# Patient Record
Sex: Female | Born: 1955
Health system: Southern US, Community
[De-identification: ages and names within clinical notes are randomized; demographics above are authoritative.]

## PROBLEM LIST (undated history)

## (undated) DIAGNOSIS — I10 Essential (primary) hypertension: Secondary | ICD-10-CM

## (undated) HISTORY — DX: Essential (primary) hypertension: I10

## (undated) HISTORY — PX: TUBAL LIGATION: SHX77

---

## 1998-07-18 ENCOUNTER — Encounter: Payer: Self-pay | Admitting: Internal Medicine

## 1998-07-18 ENCOUNTER — Ambulatory Visit (HOSPITAL_COMMUNITY): Admission: RE | Admit: 1998-07-18 | Discharge: 1998-07-18 | Payer: Self-pay | Admitting: Internal Medicine

## 2000-08-25 ENCOUNTER — Other Ambulatory Visit: Admission: RE | Admit: 2000-08-25 | Discharge: 2000-08-25 | Payer: Self-pay | Admitting: Endocrinology

## 2001-02-12 ENCOUNTER — Other Ambulatory Visit: Admission: RE | Admit: 2001-02-12 | Discharge: 2001-02-12 | Payer: Self-pay | Admitting: Obstetrics and Gynecology

## 2002-02-25 ENCOUNTER — Other Ambulatory Visit: Admission: RE | Admit: 2002-02-25 | Discharge: 2002-02-25 | Payer: Self-pay | Admitting: Obstetrics and Gynecology

## 2003-04-21 ENCOUNTER — Other Ambulatory Visit: Admission: RE | Admit: 2003-04-21 | Discharge: 2003-04-21 | Payer: Self-pay | Admitting: Obstetrics and Gynecology

## 2003-11-28 ENCOUNTER — Ambulatory Visit (HOSPITAL_COMMUNITY): Admission: RE | Admit: 2003-11-28 | Discharge: 2003-11-28 | Payer: Self-pay | Admitting: Obstetrics and Gynecology

## 2004-04-30 ENCOUNTER — Other Ambulatory Visit: Admission: RE | Admit: 2004-04-30 | Discharge: 2004-04-30 | Payer: Self-pay | Admitting: Obstetrics and Gynecology

## 2004-07-25 ENCOUNTER — Other Ambulatory Visit: Admission: RE | Admit: 2004-07-25 | Discharge: 2004-07-25 | Payer: Self-pay | Admitting: Obstetrics and Gynecology

## 2004-09-06 ENCOUNTER — Emergency Department (HOSPITAL_COMMUNITY): Admission: EM | Admit: 2004-09-06 | Discharge: 2004-09-07 | Payer: Self-pay | Admitting: Emergency Medicine

## 2005-02-06 ENCOUNTER — Other Ambulatory Visit: Admission: RE | Admit: 2005-02-06 | Discharge: 2005-02-06 | Payer: Self-pay | Admitting: Obstetrics and Gynecology

## 2005-03-28 ENCOUNTER — Encounter: Admission: RE | Admit: 2005-03-28 | Discharge: 2005-03-28 | Payer: Self-pay | Admitting: Family Medicine

## 2005-04-03 ENCOUNTER — Encounter: Admission: RE | Admit: 2005-04-03 | Discharge: 2005-04-03 | Payer: Self-pay | Admitting: Family Medicine

## 2005-04-21 ENCOUNTER — Encounter: Admission: RE | Admit: 2005-04-21 | Discharge: 2005-04-21 | Payer: Self-pay | Admitting: Family Medicine

## 2005-09-03 ENCOUNTER — Encounter (INDEPENDENT_AMBULATORY_CARE_PROVIDER_SITE_OTHER): Payer: Self-pay | Admitting: Specialist

## 2005-09-03 ENCOUNTER — Ambulatory Visit (HOSPITAL_BASED_OUTPATIENT_CLINIC_OR_DEPARTMENT_OTHER): Admission: RE | Admit: 2005-09-03 | Discharge: 2005-09-03 | Payer: Self-pay | Admitting: Surgery

## 2005-09-05 ENCOUNTER — Other Ambulatory Visit: Admission: RE | Admit: 2005-09-05 | Discharge: 2005-09-05 | Payer: Self-pay | Admitting: Obstetrics and Gynecology

## 2006-02-20 ENCOUNTER — Encounter: Admission: RE | Admit: 2006-02-20 | Discharge: 2006-02-20 | Payer: Self-pay | Admitting: Obstetrics and Gynecology

## 2006-04-07 ENCOUNTER — Encounter: Admission: RE | Admit: 2006-04-07 | Discharge: 2006-04-07 | Payer: Self-pay | Admitting: Surgery

## 2007-04-15 ENCOUNTER — Encounter: Admission: RE | Admit: 2007-04-15 | Discharge: 2007-04-15 | Payer: Self-pay | Admitting: Obstetrics and Gynecology

## 2007-11-19 ENCOUNTER — Encounter: Admission: RE | Admit: 2007-11-19 | Discharge: 2007-11-19 | Payer: Self-pay | Admitting: Obstetrics and Gynecology

## 2010-07-14 ENCOUNTER — Encounter: Payer: Self-pay | Admitting: Obstetrics and Gynecology

## 2010-11-08 NOTE — Op Note (Signed)
NAMEMARYROSE, Audrey Wells            ACCOUNT NO.:  [id number]   MEDICAL RECORD NO.:  [id number]          PATIENT TYPE:  AMB   LOCATION:  DSC                          FACILITY:  [facility]   PHYSICIAN:  Thornton Park. Daphine Deutscher, MD  DATE OF BIRTH:  1956-02-17   DATE OF PROCEDURE:  09/03/2005  DATE OF DISCHARGE:                                 OPERATIVE REPORT   [facility]S [phone]   SURGEON:  Thornton Park. Daphine Deutscher, MD.   PREOP DIAGNOSIS:  Nipple discharge and palpable mass, right back.   PROCEDURE:  1.  Cannulation of the uniductular discharge, duct of right nipple with      excision of ductules.  2.  Excision of lipoma, right flank.   ANESTHESIA:  General endotracheal.   DESCRIPTION OF PROCEDURE:  Ms. Audrey Wells was marked in the holding  area for the palpable mass in the right posterior hip region and the correct  nipple was marked as well. She is brought back to OR 3 at [facility],  where we worked on the breast first. This was prepped with chlorhexidine. I  compressed the nipple and then got a nice flush of golden oily discharge. I  cannulated that with a 00 tear duct probe. I made an infra-areolar incision  and carried this in until I could see the duct where the probe went about an  inch in underneath the nipple. I then opened the duct within the breast and  pulled the duct out of the nipple and reinserted the duct probe down in the  duct. I then cut across this up near where it came out of the nipple and  tried to excise the entire dilated ductule.  I went ahead and excised this  and as I went more medial, I encountered another dilated duct which I  excised more central and deep to the breast and I encountered a well filled  cyst and seemed to remove all of this in toto. When completed, I had a  biopsy of these connecting ductules that seemed to have the discharge that  has been coming up in her nipple.   I made a small infra-areolar incision. I cauterized the cavity with  electrocautery. Essentially no bleeding was noted. I then closed the wound  with a 4-0 and 5-0 Vicryl with Benzoin and Steri-Strips around the areola.   The patient was then turned onto her left lateral decubitus position, so  that the right flank was up and as I had previously marked that area with  her assistance back in the holding area. I prepped that area with  chlorhexidine. Made a small transverse incision over this area and then  carried this down deep near fascia where I encountered about a 3-cm lipoma  which seemed to pop out and I did excise the surrounding capsule. Bleeding  was controlled with electrocautery. The lipoma when I removed it, sort came  to pieces, but I sent it off for permanent sections.  Wound was then irrigated and closed with 4-0 Vicryl subcutaneously and  Benzoin and Steri-Strips. The patient seemed to tolerate the procedure well.  She  will be given Vicodin to have to take as needed for pain will be  followed up in the office in approximately 3 weeks.      Thornton Park Daphine Deutscher, MD  Electronically Signed     MBM/MEDQ  D:  09/03/2005  T:  09/04/2005  Job:  [id number]   cc:   Guy Sandifer. Henderson Cloud, M.D.  Fax: [phone]   Dr. Roney Marion, [facility]

## 2011-01-30 ENCOUNTER — Encounter (INDEPENDENT_AMBULATORY_CARE_PROVIDER_SITE_OTHER): Payer: Self-pay | Admitting: Surgery

## 2011-02-27 ENCOUNTER — Ambulatory Visit (INDEPENDENT_AMBULATORY_CARE_PROVIDER_SITE_OTHER): Payer: 59 | Admitting: Surgery

## 2011-02-27 ENCOUNTER — Ambulatory Visit
Admission: RE | Admit: 2011-02-27 | Discharge: 2011-02-27 | Disposition: A | Discharge status: Home / Self Care | Payer: Self-pay | Source: Ambulatory Visit | Attending: Surgery | Admitting: Surgery

## 2011-02-27 ENCOUNTER — Other Ambulatory Visit (INDEPENDENT_AMBULATORY_CARE_PROVIDER_SITE_OTHER): Payer: Self-pay | Admitting: General Surgery

## 2011-02-27 ENCOUNTER — Encounter (INDEPENDENT_AMBULATORY_CARE_PROVIDER_SITE_OTHER): Payer: Self-pay | Admitting: Surgery

## 2011-02-27 VITALS — BP 114/84 | HR 66 | Temp 98.4°F | Ht 66.0 in | Wt 167.0 lb

## 2011-02-27 DIAGNOSIS — R109 Unspecified abdominal pain: Secondary | ICD-10-CM

## 2011-02-27 NOTE — Progress Notes (Signed)
Mrs. Teti comes in today with some pain on her right side. Previously I had resected a lipoma from her right flank home September 03, 2005. I did that at the same time as I did a right nipple duct tumor excision. The path on this showed benign adipose tissue consistent with a lipoma.I reviewed a disc that she brought from [facility] but this was an ultrasound of her kidneys.  She needs imaging of this area in her right flank  She is still complaining of pain around more anterior from this and it causes her have back pain. On palpation I can feel an area there that could represent a new mass or it could be ectopic asymmetric adipose tissue. I think to discern whether this might be significant or not a MR scan of this area would be helpful.I will order that study.    Plan return after MR study.

## 2011-03-14 ENCOUNTER — Encounter (INDEPENDENT_AMBULATORY_CARE_PROVIDER_SITE_OTHER): Payer: 59 | Admitting: Surgery

## 2011-03-19 ENCOUNTER — Encounter (INDEPENDENT_AMBULATORY_CARE_PROVIDER_SITE_OTHER): Payer: 59 | Admitting: Surgery

## 2011-05-14 ENCOUNTER — Encounter (INDEPENDENT_AMBULATORY_CARE_PROVIDER_SITE_OTHER): Payer: Self-pay | Admitting: Surgery

## 2011-05-14 ENCOUNTER — Ambulatory Visit (INDEPENDENT_AMBULATORY_CARE_PROVIDER_SITE_OTHER): Payer: 59 | Admitting: Surgery

## 2011-05-14 VITALS — BP 142/76 | HR 66 | Temp 98.6°F | Resp 18 | Ht 66.0 in | Wt 164.0 lb

## 2011-05-14 DIAGNOSIS — D171 Benign lipomatous neoplasm of skin and subcutaneous tissue of trunk: Secondary | ICD-10-CM

## 2011-05-14 DIAGNOSIS — D1739 Benign lipomatous neoplasm of skin and subcutaneous tissue of other sites: Secondary | ICD-10-CM

## 2011-05-14 NOTE — Progress Notes (Signed)
Mr. And Mrs. Hollerbach come in today to discuss the area her right flank where she underwent laparoscopic cholecystectomy removed. A review the CT of her abdomen dated September which showed essentially no change in the area and also the 1.2 cm recurrent lipoma was suspected in that region. There were no suspicious aspects of this.  We discussed management options and she would like to will manage this expectantly. I recommended that she examine herself probably every 6 months in the shower first thing in the morning. If she notices a change in call us and we will see her in evaluated.  Impression lipoma right flank stable plan we'll observation

## 2011-07-21 ENCOUNTER — Ambulatory Visit (INDEPENDENT_AMBULATORY_CARE_PROVIDER_SITE_OTHER): Payer: 59

## 2011-07-21 DIAGNOSIS — I10 Essential (primary) hypertension: Secondary | ICD-10-CM

## 2012-06-23 ENCOUNTER — Ambulatory Visit (INDEPENDENT_AMBULATORY_CARE_PROVIDER_SITE_OTHER): Payer: 59 | Admitting: Emergency Medicine

## 2012-06-23 VITALS — BP 128/80 | HR 72 | Temp 98.9°F | Resp 17 | Ht 67.0 in | Wt 165.0 lb

## 2012-06-23 DIAGNOSIS — R300 Dysuria: Secondary | ICD-10-CM

## 2012-06-23 DIAGNOSIS — N3000 Acute cystitis without hematuria: Secondary | ICD-10-CM

## 2012-06-23 LAB — POCT URINALYSIS DIPSTICK
Bilirubin, UA: NEGATIVE
Ketones, UA: NEGATIVE
Nitrite, UA: POSITIVE
Protein, UA: NEGATIVE

## 2012-06-23 LAB — POCT UA - MICROSCOPIC ONLY

## 2012-06-23 MED ORDER — CIPROFLOXACIN HCL 500 MG PO TABS: 500.0000 mg | ORAL_TABLET | Freq: Two times a day (BID) | ORAL | Status: DC

## 2012-06-23 MED ORDER — PHENAZOPYRIDINE HCL 200 MG PO TABS: 200.0000 mg | ORAL_TABLET | Freq: Three times a day (TID) | ORAL | Status: AC | PRN

## 2012-06-23 NOTE — Progress Notes (Signed)
[facility] [address] [place] [address] [phone]  Date:  06/23/2012   Name:  Audrey Wells   DOB:  March 18, 1956   MRN:  [id number]  PCP:  Provider Not In System    Chief Complaint: Urinary Tract Infection   History of Present Illness:  Audrey Wells is a 57 y.o. very pleasant female patient who presents with the following:  Monday developed dysuria, urgency and frequent urination.  No back pain, vaginal discharge or bleeding. No stool change, no fever or chills.  No change in activities or appetite.  No improvement with azo standard.  There is no problem list on file for this patient.   Past Medical History  Diagnosis Date  . Hypertension     History reviewed. No pertinent past surgical history.  History  Substance Use Topics  . Smoking status: Never Smoker   . Smokeless tobacco: Not on file  . Alcohol Use: No    Family History  Problem Relation Age of Onset  . Arthritis Mother   . Hypertension Father   . Hypertension Brother     No Known Allergies  Medication list has been reviewed and updated.  Current Outpatient Prescriptions on File Prior to Visit  Medication Sig Dispense Refill  . aspirin 81 MG tablet Take 81 mg by mouth daily.        . hydrochlorothiazide (HYDRODIURIL) 12.5 MG tablet       . meloxicam (MOBIC) 7.5 MG tablet         Review of Systems:  As per HPI, otherwise negative.    Physical Examination: Filed Vitals:   06/23/12 1412  BP: 128/80  Pulse: 72  Temp: 98.9 F (37.2 C)  Resp: 17   Filed Vitals:   06/23/12 1412  Height: 5\' 7"  (1.702 m)  Weight: 165 lb (74.844 kg)   Body mass index is 25.84 kg/(m^2). Ideal Body Weight: Weight in (lb) to have BMI = 25: 159.3    GEN: WDWN, NAD, Non-toxic, Alert & Oriented x 3 HEENT: Atraumatic, Normocephalic.  Ears and Nose: No external deformity. EXTR: No clubbing/cyanosis/edema NEURO: Normal gait.  PSYCH: Normally interactive. Conversant. Not  depressed or anxious appearing.  Calm demeanor.  ABD:  Benign no CVA tenderness  Assessment and Plan: Acute cystitis Pyridium cipro Follow up as needed  Carmelina Dane, MD  Results for orders placed in visit on 06/23/12  POCT UA - MICROSCOPIC ONLY      Component Value Range   WBC, Ur, HPF, POC 12-15     RBC, urine, microscopic 17-25     Bacteria, U Microscopic 3+     Mucus, UA neg     Epithelial cells, urine per micros 0-3     Crystals, Ur, HPF, POC neg     Casts, Ur, LPF, POC neg     Yeast, UA neg    POCT URINALYSIS DIPSTICK      Component Value Range   Color, UA yellow     Clarity, UA cloudy     Glucose, UA neg     Bilirubin, UA neg     Ketones, UA neg     Spec Grav, UA 1.015     Blood, UA large     pH, UA 7.0     Protein, UA neg     Urobilinogen, UA 0.2     Nitrite, UA positive     Leukocytes, UA moderate (2+)

## 2012-06-24 NOTE — Progress Notes (Signed)
Reviewed and agree.

## 2012-07-17 ENCOUNTER — Other Ambulatory Visit: Payer: Self-pay | Admitting: Family Medicine

## 2012-07-19 NOTE — Telephone Encounter (Signed)
HCTZ not prescribed by our office.

## 2012-07-23 ENCOUNTER — Other Ambulatory Visit: Payer: Self-pay | Admitting: Family Medicine

## 2012-07-23 NOTE — Telephone Encounter (Signed)
Pharmacy just called regarding this patient.  Best [phone]

## 2012-07-23 NOTE — Telephone Encounter (Signed)
Please pull paper chart.  

## 2012-07-23 NOTE — Telephone Encounter (Signed)
Patient is out of her BP diuretic medication and needs a refill. States she contacted her pharmacy and they sent [place] a request and they received a response from our office stating that she was not a patient here. Patient states our office is her PCP and she has been coming here for years so that is a concern for her. She is out of her medication. Uses [facility] on [address] Call Back # is [phone].

## 2012-07-23 NOTE — Telephone Encounter (Signed)
Chart pulled to PA pool at nurses station [phone]

## 2012-07-25 ENCOUNTER — Other Ambulatory Visit: Payer: Self-pay | Admitting: Physician Assistant

## 2012-08-24 ENCOUNTER — Other Ambulatory Visit: Payer: Self-pay | Admitting: Physician Assistant

## 2012-09-20 ENCOUNTER — Other Ambulatory Visit: Payer: Self-pay | Admitting: Physician Assistant

## 2012-09-24 ENCOUNTER — Ambulatory Visit (INDEPENDENT_AMBULATORY_CARE_PROVIDER_SITE_OTHER): Payer: 59 | Admitting: Physician Assistant

## 2012-09-24 VITALS — BP 164/90 | HR 66 | Temp 98.5°F | Resp 16 | Ht 67.0 in | Wt 167.0 lb

## 2012-09-24 DIAGNOSIS — I10 Essential (primary) hypertension: Secondary | ICD-10-CM

## 2012-09-24 MED ORDER — HYDROCHLOROTHIAZIDE 12.5 MG PO TABS: 12.5000 mg | ORAL_TABLET | Freq: Every day | ORAL | Status: DC

## 2012-09-24 NOTE — Progress Notes (Signed)
  Subjective:    Patient ID: Audrey Wells, female    DOB: March 19, 1956, 57 y.o.   MRN: [id number]  HPI 18 year ol dd female presents for refills of blood pressure medication.  Has been doing well on current dose - HCTZ 12.5 mg daily.  Gets annual exams from GYN that include lipids.  States everything has been normal in the past and her blood pressures at home are typically 130/80's.  At last OV in 06/2012 BP was well controlled. She admits to recent increase in fatty foods and decrease in exercise.  Denies chest pain, SOB, headaches, palpitations, nausea, or vomiting.      Review of Systems  Constitutional: Negative for fever and chills.  Eyes: Negative for visual disturbance.  Respiratory: Negative for chest tightness and shortness of breath.   Cardiovascular: Negative for chest pain and palpitations.  Neurological: Negative for dizziness and headaches.       Objective:   Physical Exam  Constitutional: She is oriented to person, place, and time. She appears well-developed and well-nourished.  HENT:  Head: Normocephalic and atraumatic.  Right Ear: External ear normal.  Left Ear: External ear normal.  Eyes: Conjunctivae are normal.  Neck: Normal range of motion. Neck supple. No thyromegaly present.  Cardiovascular: Normal rate, regular rhythm and normal heart sounds.   Pulmonary/Chest: Effort normal and breath sounds normal.  Lymphadenopathy:    She has no cervical adenopathy.  Neurological: She is alert and oriented to person, place, and time.  Psychiatric: She has a normal mood and affect. Her behavior is normal. Judgment and thought content normal.          Assessment & Plan:  Essential hypertension, benign - Plan: Comprehensive metabolic panel, hydrochlorothiazide (HYDRODIURIL) 12.5 MG tablet  CMET pending All other labs with GYN.  Continue HCTZ 12.5 mg daily. Check BP's at home and at annual - if continued to be elevated above 140/90, recommend recheck here in 6 months.

## 2012-09-25 LAB — COMPREHENSIVE METABOLIC PANEL
ALT: 20 U/L (ref 0–35)
AST: 25 U/L (ref 0–37)
Albumin: 4.4 g/dL (ref 3.5–5.2)
Alkaline Phosphatase: 70 U/L (ref 39–117)
BUN: 13 mg/dL (ref 6–23)
CO2: 30 mEq/L (ref 19–32)
Calcium: 9.6 mg/dL (ref 8.4–10.5)
Chloride: 104 mEq/L (ref 96–112)
Creat: 0.83 mg/dL (ref 0.50–1.10)
Glucose, Bld: 80 mg/dL (ref 70–99)
Potassium: 3.5 mEq/L (ref 3.5–5.3)
Sodium: 142 mEq/L (ref 135–145)
Total Bilirubin: 0.3 mg/dL (ref 0.3–1.2)
Total Protein: 7.6 g/dL (ref 6.0–8.3)

## 2012-10-26 ENCOUNTER — Telehealth: Payer: Self-pay

## 2012-10-26 NOTE — Telephone Encounter (Signed)
Patient is wondering if we got refill request for meloxicam please call patient at [phone]

## 2012-10-27 ENCOUNTER — Telehealth: Payer: Self-pay

## 2012-10-27 NOTE — Telephone Encounter (Signed)
Records have been faxed to Dr.Votek now.

## 2012-10-27 NOTE — Telephone Encounter (Signed)
We would need to see her for this since we have never prescribed this for her. From the note it seems like she has primarily seen her GYN for primary care, so maybe she needs to call them.

## 2012-10-27 NOTE — Telephone Encounter (Signed)
Records were already sent today by fax. Will double check if it went through epic.

## 2012-10-27 NOTE — Telephone Encounter (Signed)
PATIENT STATES SHE HAS CALLED 4X ABOUT A FAX THAT SHE SENT OVER AND HAS NOT HEARD ANYTHING YET. STATES THAT SHE NEEDS COPIES OF HER CLINICAL PANEL SENT OVER TO THE OFFICE OF ANNA VOYTEK. PATIENT DID NOT GIVE THE FAX NUMBER. PT CAN BE REACHED AT [phone].

## 2012-10-27 NOTE — Telephone Encounter (Signed)
Was seen for elevated BP, do not see where we have given this med? Office visit or okay?

## 2012-10-27 NOTE — Telephone Encounter (Signed)
Patient has been getting this from Dr Charlett Blake, she does not want [place] to renew this, she wants [place] to give her lab results to Dr Charlett Blake, so she can refill this for her. Labs faxed.

## 2013-04-12 ENCOUNTER — Ambulatory Visit: Payer: 59

## 2013-04-12 ENCOUNTER — Ambulatory Visit (INDEPENDENT_AMBULATORY_CARE_PROVIDER_SITE_OTHER): Payer: 59 | Admitting: Family Medicine

## 2013-04-12 VITALS — BP 140/80 | HR 72 | Temp 98.6°F | Resp 16 | Ht 66.0 in | Wt 168.0 lb

## 2013-04-12 DIAGNOSIS — R079 Chest pain, unspecified: Secondary | ICD-10-CM

## 2013-04-12 DIAGNOSIS — M940 Chondrocostal junction syndrome [Tietze]: Secondary | ICD-10-CM

## 2013-04-12 LAB — POCT CBC
Granulocyte percent: 53.2 %G (ref 37–80)
HCT, POC: 38.1 % (ref 37.7–47.9)
Hemoglobin: 11.9 g/dL — AB (ref 12.2–16.2)
Lymph, poc: 2.7 (ref 0.6–3.4)
MCH, POC: 28.5 pg (ref 27–31.2)
MCHC: 31.2 g/dL — AB (ref 31.8–35.4)
MCV: 91.1 fL (ref 80–97)
MID (cbc): 0.4 (ref 0–0.9)
MPV: 8.6 fL (ref 0–99.8)
POC Granulocyte: 3.6 (ref 2–6.9)
POC LYMPH PERCENT: 40.5 % (ref 10–50)
POC MID %: 6.3 %M (ref 0–12)
Platelet Count, POC: 232 10*3/uL (ref 142–424)
RBC: 4.18 M/uL (ref 4.04–5.48)
RDW, POC: 13.5 %
WBC: 6.7 10*3/uL (ref 4.6–10.2)

## 2013-04-12 NOTE — Progress Notes (Signed)
[facility]:  Office Visit  Chief Complaint:  Chief Complaint  Patient presents with  . Chest Pain    feels like she has a pulled muscle in chest x 2 days    HPI: Audrey Wells is a 57 y.o. female who is here for  2 day history of midsubsternal CP, started after she worked out after lifting weights over her head. She is not sure. She first noticed it when she was at at rest at work, hurts only when she takes deep breaths, she took ASA and that seems to help it.  Rates it for 5/10 pain only last for few seconds, only when she takes deep breaths, catches her and cuts her breath of.  She denies HA, double vision, clamminess, n/v/adb pain, jaw pain.No prior chest wall trauma. Denies SOB or URI sxs Risk factors for CAD: HTN Nonsmoker Denies  DM or XOL No family hx premature MI BP at home 140/80, takes meds regular She has some reflux sxs but this does not feel like reflux, had [place] sandwich the night before this happened No risk factors for PE   Past Medical History  Diagnosis Date  . Hypertension    History reviewed. No pertinent past surgical history. History   Social History  . Marital Status: Married    Spouse Name: N/A    Number of Children: N/A  . Years of Education: N/A   Social History Main Topics  . Smoking status: Never Smoker   . Smokeless tobacco: None  . Alcohol Use: No  . Drug Use: No  . Sexual Activity: Yes    Birth Control/ Protection: Surgical   Other Topics Concern  . None   Social History Narrative  . None   Family History  Problem Relation Age of Onset  . Arthritis Mother   . Hypertension Father   . Hypertension Brother    No Known Allergies Prior to Admission medications   Medication Sig Start Date End Date Taking? Authorizing Provider  aspirin 81 MG tablet Take 81 mg by mouth daily.     Yes Historical Provider, MD  hydrochlorothiazide (HYDRODIURIL) 12.5 MG tablet Take 1 tablet (12.5 mg total) by mouth daily.  09/24/12  Yes Heather Jaquita Rector, PA-C  meloxicam (MOBIC) 7.5 MG tablet  01/08/11  Yes Historical Provider, MD  ciprofloxacin (CIPRO) 500 MG tablet Take 1 tablet (500 mg total) by mouth 2 (two) times daily. 06/23/12   Phillips Odor, MD     ROS: The patient denies fevers, chills, night sweats, unintentional weight loss, palpitations, wheezing, dyspnea on exertion, nausea, vomiting, abdominal pain, dysuria, hematuria, melena, numbness, weakness, or tingling.   All other systems have been reviewed and were otherwise negative with the exception of those mentioned in the HPI and as above.    PHYSICAL EXAM: Filed Vitals:   04/12/13 1810  BP: 140/80  Pulse: 72  Temp: 98.6 F (37 C)  Resp: 16   Filed Vitals:   04/12/13 1810  Height: 5\' 6"  (1.676 m)  Weight: 168 lb (76.204 kg)   Body mass index is 27.13 kg/(m^2).  General: Alert, no acute distress HEENT:  Normocephalic, atraumatic, oropharynx patent. EOMI, PERRLA Cardiovascular:  Regular rate and rhythm, no rubs murmurs or gallops.  No Carotid bruits, radial pulse intact. No pedal edema. Nontender  chest wall on palpation, minimal pain with elevation of arm Respiratory: Clear to auscultation bilaterally.  No wheezes, rales, or rhonchi.  No cyanosis, no use of accessory musculature  GI: No organomegaly, abdomen is soft and non-tender, positive bowel sounds.  No masses. Skin: No rashes. Neurologic: Facial musculature symmetric. Psychiatric: Patient is appropriate throughout our interaction. Lymphatic: No cervical lymphadenopathy Musculoskeletal: Gait intact.   LABS: Results for orders placed in visit on 04/12/13  POCT CBC      Result Value Range   WBC 6.7  4.6 - 10.2 K/uL   Lymph, poc 2.7  0.6 - 3.4   POC LYMPH PERCENT 40.5  10 - 50 %L   MID (cbc) 0.4  0 - 0.9   POC MID % 6.3  0 - 12 %M   POC Granulocyte 3.6  2 - 6.9   Granulocyte percent 53.2  37 - 80 %G   RBC 4.18  4.04 - 5.48 M/uL   Hemoglobin 11.9 (*) 12.2 - 16.2 g/dL   HCT, POC  16.1  09.6 - 47.9 %   MCV 91.1  80 - 97 fL   MCH, POC 28.5  27 - 31.2 pg   MCHC 31.2 (*) 31.8 - 35.4 g/dL   RDW, POC 04.5     Platelet Count, POC 232  142 - 424 K/uL   MPV 8.6  0 - 99.8 fL     EKG/XRAY:   Primary read interpreted by Dr. Conley Rolls at [facility] No acute cardiopulmonary process, her right hemidiaphragm is slighlty more elevated than normal EKG NSR nonspecific T wave cahnges   ASSESSMENT/PLAN: Encounter Diagnoses  Name Primary?  . Chest pain Yes  . Chest pain, unspecified   . Costochondritis    Rx Mobic 7.5 mg daily , may take up to 2 pills prn pain Go to ER prn worsening sxs CMP pending F/u prn, will refer to cardiologist if no improvement , currently patient declined referral Gross sideeffects, risk and benefits, and alternatives of medications d/w patient. Patient is aware that all medications have potential sideeffects and we are unable to predict every sideeffect or drug-drug interaction that may occur.  Hamilton Capri PHUONG, DO 04/12/2013 7:33 PM

## 2013-04-12 NOTE — Patient Instructions (Signed)
Costochondritis Costochondritis (Tietze syndrome), or costochondral separation, is a swelling and irritation (inflammation) of the tissue (cartilage) that connects your ribs with your breastbone (sternum). It may occur on its own (spontaneously), through damage caused by an accident (trauma), or simply from coughing or minor exercise. It may take up to 6 weeks to get better and longer if you are unable to be conservative in your activities. HOME CARE INSTRUCTIONS   Avoid exhausting physical activity. Try not to strain your ribs during normal activity. This would include any activities using chest, belly (abdominal), and side muscles, especially if heavy weights are used.  Use ice for 15-20 minutes per hour while awake for the first 2 days. Place the ice in a plastic bag, and place a towel between the bag of ice and your skin.  Only take over-the-counter or prescription medicines for pain, discomfort, or fever as directed by your caregiver. SEEK IMMEDIATE MEDICAL CARE IF:   Your pain increases or you are very uncomfortable.  You have a fever.  You develop difficulty with your breathing.  You cough up blood.  You develop worse chest pains, shortness of breath, sweating, or vomiting.  You develop new, unexplained problems (symptoms). MAKE SURE YOU:   Understand these instructions.  Will watch your condition.  Will get help right away if you are not doing well or get worse. Document Released: 03/19/2005 Document Revised: 09/01/2011 Document Reviewed: 01/26/2008 Exit[employer] Patient Information 2014 [employer].  

## 2013-04-13 LAB — COMPREHENSIVE METABOLIC PANEL
ALT: 15 U/L (ref 0–35)
AST: 19 U/L (ref 0–37)
Alkaline Phosphatase: 66 U/L (ref 39–117)
Chloride: 105 mEq/L (ref 96–112)
Creat: 0.61 mg/dL (ref 0.50–1.10)
Glucose, Bld: 86 mg/dL (ref 70–99)
Sodium: 141 mEq/L (ref 135–145)
Total Bilirubin: 0.3 mg/dL (ref 0.3–1.2)

## 2013-04-13 LAB — COMPREHENSIVE METABOLIC PANEL WITH GFR
Albumin: 4.1 g/dL (ref 3.5–5.2)
BUN: 10 mg/dL (ref 6–23)
CO2: 29 meq/L (ref 19–32)
Calcium: 9.4 mg/dL (ref 8.4–10.5)
Potassium: 3.3 meq/L — ABNORMAL LOW (ref 3.5–5.3)
Total Protein: 7.1 g/dL (ref 6.0–8.3)

## 2013-04-21 ENCOUNTER — Telehealth: Payer: Self-pay | Admitting: Family Medicine

## 2013-04-21 DIAGNOSIS — E876 Hypokalemia: Secondary | ICD-10-CM

## 2013-04-21 NOTE — Telephone Encounter (Addendum)
Spoke to patient about blood work. K was low, she has had this before on HCTZ. Advise she can eat high potassium foods ie bananas or coconut water and she can come back in for blood draw only for BMP in 1 week to recheck. Currently CP free

## 2013-08-11 ENCOUNTER — Ambulatory Visit (INDEPENDENT_AMBULATORY_CARE_PROVIDER_SITE_OTHER): Payer: 59 | Admitting: Emergency Medicine

## 2013-08-11 VITALS — BP 126/70 | HR 82 | Temp 98.5°F | Resp 16 | Ht 66.0 in | Wt 169.2 lb

## 2013-08-11 DIAGNOSIS — B9689 Other specified bacterial agents as the cause of diseases classified elsewhere: Secondary | ICD-10-CM

## 2013-08-11 DIAGNOSIS — I10 Essential (primary) hypertension: Secondary | ICD-10-CM

## 2013-08-11 DIAGNOSIS — R300 Dysuria: Secondary | ICD-10-CM

## 2013-08-11 DIAGNOSIS — N760 Acute vaginitis: Secondary | ICD-10-CM

## 2013-08-11 LAB — POCT WET PREP WITH KOH
KOH PREP POC: NEGATIVE
Trichomonas, UA: NEGATIVE
YEAST WET PREP PER HPF POC: NEGATIVE

## 2013-08-11 LAB — POCT URINALYSIS DIPSTICK
Bilirubin, UA: NEGATIVE
GLUCOSE UA: NEGATIVE
Ketones, UA: NEGATIVE
Leukocytes, UA: NEGATIVE
NITRITE UA: NEGATIVE
PROTEIN UA: NEGATIVE
Spec Grav, UA: <=1.005
UROBILINOGEN UA: 0.2
pH, UA: 6.5

## 2013-08-11 LAB — POCT UA - MICROSCOPIC ONLY
Bacteria, U Microscopic: NEGATIVE
CRYSTALS, UR, HPF, POC: NEGATIVE
Casts, Ur, LPF, POC: NEGATIVE
Mucus, UA: NEGATIVE
Yeast, UA: NEGATIVE

## 2013-08-11 MED ORDER — METRONIDAZOLE 500 MG PO TABS: 500.0000 mg | ORAL_TABLET | Freq: Two times a day (BID) | ORAL | Status: DC

## 2013-08-11 MED ORDER — HYDROCHLOROTHIAZIDE 12.5 MG PO TABS: 12.5000 mg | ORAL_TABLET | Freq: Every day | ORAL | Status: DC

## 2013-08-11 NOTE — Progress Notes (Signed)
[facility] [address] [place] [address] [phone]  Date:  08/11/2013   Name:  Audrey Wells   DOB:  1956-01-28   MRN:  [id number]  PCP:  Provider Not In System    Chief Complaint: Dysuria and Medication Refill   History of Present Illness:  Audrey Wells is a 58 y.o. very pleasant female patient who presents with the following:  Has dysuria and itching in vagina.  No history of discharge. No dyspareunia.  No fever or chills.  No nausea or vomiting. No stool change.  No improvement with over the counter medications or other home remedies. Denies other complaint or health concern today.   There are no active problems to display for this patient.   Past Medical History  Diagnosis Date  . Hypertension     History reviewed. No pertinent past surgical history.  History  Substance Use Topics  . Smoking status: Never Smoker   . Smokeless tobacco: Not on file  . Alcohol Use: No    Family History  Problem Relation Age of Onset  . Arthritis Mother   . Hypertension Father   . Hypertension Brother     No Known Allergies  Medication list has been reviewed and updated.  Current Outpatient Prescriptions on File Prior to Visit  Medication Sig Dispense Refill  . hydrochlorothiazide (HYDRODIURIL) 12.5 MG tablet Take 1 tablet (12.5 mg total) by mouth daily.  90 tablet  3  . meloxicam (MOBIC) 7.5 MG tablet       . aspirin 81 MG tablet Take 81 mg by mouth daily.         No current facility-administered medications on file prior to visit.    Review of Systems:  As per HPI, otherwise negative.    Physical Examination: Filed Vitals:   08/11/13 1831  BP: 126/70  Pulse: 82  Temp: 98.5 F (36.9 C)  Resp: 16   Filed Vitals:   08/11/13 1831  Height: 5\' 6"  (1.676 m)  Weight: 169 lb 3.2 oz (76.749 kg)   Body mass index is 27.32 kg/(m^2). Ideal Body Weight: Weight in (lb) to have BMI = 25: 154.6   GEN: WDWN, NAD, Non-toxic, Alert &  Oriented x 3 HEENT: Atraumatic, Normocephalic.  Ears and Nose: No external deformity. EXTR: No clubbing/cyanosis/edema NEURO: Normal gait.  PSYCH: Normally interactive. Conversant. Not depressed or anxious appearing.  Calm demeanor.    Assessment and Plan: BV Flagyl  Signed,  Ellison Carwin, MD  Results for orders placed in visit on 08/11/13  POCT URINALYSIS DIPSTICK      Result Value Ref Range   Color, UA yellow     Clarity, UA clear     Glucose, UA neg     Bilirubin, UA neg     Ketones, UA neg     Spec Grav, UA <=1.005     Blood, UA moderate     pH, UA 6.5     Protein, UA neg     Urobilinogen, UA 0.2     Nitrite, UA neg     Leukocytes, UA Negative    POCT UA - MICROSCOPIC ONLY      Result Value Ref Range   WBC, Ur, HPF, POC 0-1     RBC, urine, microscopic 4-6     Bacteria, U Microscopic neg     Mucus, UA neg     Epithelial cells, urine per micros 0-1     Crystals, Ur, HPF, POC  neg     Casts, Ur, LPF, POC neg     Yeast, UA neg     Results for orders placed in visit on 08/11/13  POCT URINALYSIS DIPSTICK      Result Value Ref Range   Color, UA yellow     Clarity, UA clear     Glucose, UA neg     Bilirubin, UA neg     Ketones, UA neg     Spec Grav, UA <=1.005     Blood, UA moderate     pH, UA 6.5     Protein, UA neg     Urobilinogen, UA 0.2     Nitrite, UA neg     Leukocytes, UA Negative    POCT UA - MICROSCOPIC ONLY      Result Value Ref Range   WBC, Ur, HPF, POC 0-1     RBC, urine, microscopic 4-6     Bacteria, U Microscopic neg     Mucus, UA neg     Epithelial cells, urine per micros 0-1     Crystals, Ur, HPF, POC neg     Casts, Ur, LPF, POC neg     Yeast, UA neg    POCT WET PREP WITH KOH      Result Value Ref Range   Trichomonas, UA Negative     Clue Cells Wet Prep HPF POC 0-1     Epithelial Wet Prep HPF POC 5-8     Yeast Wet Prep HPF POC neg     Bacteria Wet Prep HPF POC 1+     RBC Wet Prep HPF POC 0-1     WBC Wet Prep HPF POC 10-20      KOH Prep POC Negative

## 2013-08-11 NOTE — Patient Instructions (Signed)

## 2013-08-15 ENCOUNTER — Telehealth: Payer: Self-pay

## 2013-08-15 NOTE — Telephone Encounter (Signed)
PT STATES DR Ouida Sills SAID SHE DIDN'T NEED TO GO ON BP MEDS, BUT HER EYES ARE GLASSY AND SHE WANTED TO KNOW IF SHE COULD TAKE A PILL EVERY OTHER DAY PLEASE CALL [facility] ON [address] AND [address]

## 2013-08-17 NOTE — Telephone Encounter (Signed)
  Lm for rtn call for more information.

## 2013-08-21 NOTE — Telephone Encounter (Signed)
Pt.notified

## 2013-08-21 NOTE — Telephone Encounter (Signed)
No.  Has no relation to BP

## 2013-10-24 ENCOUNTER — Ambulatory Visit (INDEPENDENT_AMBULATORY_CARE_PROVIDER_SITE_OTHER): Payer: 59 | Admitting: Physician Assistant

## 2013-10-24 ENCOUNTER — Ambulatory Visit: Payer: 59

## 2013-10-24 VITALS — BP 126/82 | HR 83 | Temp 98.3°F | Resp 16 | Ht 66.0 in | Wt 168.0 lb

## 2013-10-24 DIAGNOSIS — M79609 Pain in unspecified limb: Secondary | ICD-10-CM

## 2013-10-24 DIAGNOSIS — M79672 Pain in left foot: Secondary | ICD-10-CM

## 2013-10-24 NOTE — Patient Instructions (Signed)
Please increase the meloxicam to twice daily to help reduce the inflammation. Also, ice massage can help.   Avoid wearing shoes with a strap across the area of discomfort. Consider changing shoes to give your feet more support, since you spend so much time standing/walking at work.

## 2013-10-24 NOTE — Progress Notes (Signed)
   Subjective:    Patient ID: Audrey Wells, female    DOB: May 19, 1956, 58 y.o.   MRN: [id number]   PCP: PROVIDER NOT IN SYSTEM  Chief Complaint  Patient presents with  . Ankle Pain    left ankle swollen and sore with walking   Medications, allergies, past medical history, surgical history, family history, social history and problem list reviewed and updated.  HPI  First noticed about 5-6 days ago that the LEFT ankle was painful and swollen. No specific trauma or injury recalled.  She notes that her work requires her to stand all day.  The day she first noticed the pain she was wearing a pair that had a Velcro strap across the top of the foot, where she has the discomfort. She wears flats, and her husband tells her repeatedly that she needs shoes with more support.  Review of Systems As above.    Objective:   Physical Exam  Constitutional: She is oriented to person, place, and time. She appears well-developed and well-nourished. She is active and cooperative. No distress.  BP 126/82  Pulse 83  Temp(Src) 98.3 F (36.8 C) (Oral)  Resp 16  Ht 5\' 6"  (1.676 m)  Wt 168 lb (76.204 kg)  BMI 27.13 kg/m2  SpO2 97%   Eyes: Conjunctivae are normal. No scleral icterus.  Cardiovascular: Normal rate and regular rhythm.   Pulmonary/Chest: Effort normal.  Musculoskeletal:       Left ankle: Normal.       Left foot: She exhibits tenderness, bony tenderness and swelling (trace). She exhibits normal range of motion, normal capillary refill, no crepitus, no deformity and no laceration.       Feet:  Neurological: She is alert and oriented to person, place, and time.  Skin: Skin is warm and dry. There is erythema (very mild streak of erythema on the dorsum of the foot).  Psychiatric: She has a normal mood and affect. Her behavior is normal.      LEFT Foot: [facility] reading (PRIMARY) by  Dr. Laney Pastor.  Some degenerative changes noted in the MTPs. No acute bony deformity.      Assessment  & Plan:  1. Foot pain, left Probable tendonitis, aggravated by the strap of her shoe.  Increase meloxicam to 7.5 BID or 15 mg QD, add ice massage and encourage more supportive footwear (without a strap!) - DG Foot Complete Left; Future   Fara Chute, PA-C Physician Assistant-Certified [facility]

## 2014-06-21 ENCOUNTER — Other Ambulatory Visit: Payer: Self-pay | Admitting: Obstetrics and Gynecology

## 2014-06-23 LAB — HM COLONOSCOPY

## 2014-06-26 LAB — CYTOLOGY - PAP

## 2014-09-16 ENCOUNTER — Other Ambulatory Visit: Payer: Self-pay | Admitting: Emergency Medicine

## 2014-09-17 ENCOUNTER — Other Ambulatory Visit: Payer: Self-pay | Admitting: Family Medicine

## 2014-10-15 ENCOUNTER — Other Ambulatory Visit: Payer: Self-pay | Admitting: Family Medicine

## 2014-11-02 ENCOUNTER — Ambulatory Visit (INDEPENDENT_AMBULATORY_CARE_PROVIDER_SITE_OTHER): Payer: 59 | Admitting: Sports Medicine

## 2014-11-02 VITALS — BP 144/88 | HR 75 | Temp 97.9°F | Resp 20 | Ht 66.5 in | Wt 168.4 lb

## 2014-11-02 DIAGNOSIS — I10 Essential (primary) hypertension: Secondary | ICD-10-CM

## 2014-11-02 MED ORDER — HYDROCHLOROTHIAZIDE 25 MG PO TABS: 25.0000 mg | ORAL_TABLET | Freq: Every day | ORAL | Status: DC

## 2014-11-02 NOTE — Progress Notes (Signed)
  Audrey Wells - 59 y.o. female MRN [id number]  Date of birth: February 07, 1956  SUBJECTIVE: Chief Complaint  Patient presents with  . Medication Refill    needs refills of BP medications    HPI:   patient here for medication refill. No acute complaints.  [facility] maintenance.  Up-to-date on mammogram, colonoscopy, 2..  Reports recent lab work in December at gynecologist and reported as normal.  No other acute complaints      ROS:  denies chest pain, shortness of breath, orthopnea, PND, difficulty walking, dyspnea with on exertion. Reports moderate amount of activity throughout the week, exercising 3 times per week at the gym with her husband.  Diet is highly processed with high salt intake.    HISTORY:  Past Medical, Surgical, Social, and Family History reviewed & updated per EMR.  Pertinent Historical Findings include:  reports that she has never smoked. She has never used smokeless tobacco. Otherwise healthy, nonsmoker.   OBJECTIVE:  VS:   HT:5' 6.5" (168.9 cm)   WT:168 lb 6 oz (76.374 kg)  BMI:26.8          BP:(!) 144/88 mmHg  HR:75bpm  TEMP:97.9 F (36.6 C)(Oral)  RESP:98 %  BP Readings from Last 3 Encounters:  11/02/14 144/88  10/24/13 126/82  08/11/13 126/70     Physical Exam  Constitutional: She is well-developed, well-nourished, and in no distress.  HENT:  Head: Normocephalic and atraumatic.  Right Ear: External ear normal.  Left Ear: External ear normal.  Neck: No JVD present. No tracheal deviation present.  Cardiovascular: Normal rate, regular rhythm and normal heart sounds.  Exam reveals no gallop and no friction rub.   No murmur heard. Pulmonary/Chest: Effort normal and breath sounds normal. No respiratory distress. She has no wheezes. She exhibits no tenderness.  Abdominal: Soft.  Musculoskeletal: She exhibits no edema or tenderness.  Neurological: She is alert.  Moves all 4 extremities  spontaneously; no lateralization.  Skin: Skin is warm and dry. She is not diaphoretic.  Psychiatric: Mood, memory, affect and judgment normal.  Vitals reviewed.   ASSESSMENT: 1. Essential hypertension, benign    PLAN: See problem based charting & AVS for additional documentation.  Titrate hydrochlorothiazide to 25 mg by mouth daily. She reports blood pressures being elevated in the 140s when she checks them in the community.  Discussed continuation of moderate exercise weekly  Discussed premise of DASH diet > Return in about 6 months (around 05/05/2015).

## 2015-01-05 ENCOUNTER — Telehealth: Payer: Self-pay

## 2015-01-05 NOTE — Telephone Encounter (Signed)
Pt was prescribed hydrodiuril on 051216 and thinks it is causing acid reflux. She would like a return call to discuss alternative rx.

## 2015-01-08 NOTE — Telephone Encounter (Signed)
Is this a typical side effect?

## 2015-01-08 NOTE — Telephone Encounter (Signed)
Pt is taking HCTZ 12.5 mg BID and BP is well controlled. She would rather try prilosec than add another medication at this time. She has already tried zantac and not helpful. Advised drinking a glass of water with pill and no reclining for 30 minutes after taking her meds. She will call if symptoms do not improve.

## 2015-09-16 ENCOUNTER — Ambulatory Visit (INDEPENDENT_AMBULATORY_CARE_PROVIDER_SITE_OTHER): Payer: 59 | Admitting: Family Medicine

## 2015-09-16 ENCOUNTER — Other Ambulatory Visit: Payer: Self-pay | Admitting: Family Medicine

## 2015-09-16 VITALS — BP 114/70 | HR 80 | Temp 98.8°F | Resp 18 | Ht 66.5 in | Wt 162.4 lb

## 2015-09-16 DIAGNOSIS — K219 Gastro-esophageal reflux disease without esophagitis: Secondary | ICD-10-CM

## 2015-09-16 DIAGNOSIS — R1310 Dysphagia, unspecified: Secondary | ICD-10-CM | POA: Diagnosis not present

## 2015-09-16 MED ORDER — OMEPRAZOLE 40 MG PO CPDR: 40.0000 mg | DELAYED_RELEASE_CAPSULE | Freq: Every day | ORAL | Status: DC

## 2015-09-16 NOTE — Progress Notes (Addendum)
Subjective:    Patient ID: Audrey Wells, female    DOB: 03-07-1956, 60 y.o.   MRN: [id number] By signing my name below, I, Judithe Modest, attest that this documentation has been prepared under the direction and in the presence of Delman Cheadle, MD. Electronically Signed: Judithe Modest, ER Scribe. 09/16/2015. 3:31 PM.  Chief Complaint  Patient presents with  . Oral Swelling    for the past few years. Comes and goes.     HPI HPI Comments: Audrey Wells is a 60 y.o. female who presents to [facility] complaining of intermittent throat closing and inability to swallow food for the last couple of years. She has a hx of heart burn for the last two years. She generally takes one tums every night due to her heart burn sx. She has not had any change in her voice. She has not hx of asthma or seasonal allergies. No hx of swelling of lips or tongue. She does not take any other GERD medications. She does not have a PCP. She never feels any swelling in her throat or chest pain.   Past Medical History  Diagnosis Date  . Hypertension    No Known Allergies  Current Outpatient Prescriptions on File Prior to Visit  Medication Sig Dispense Refill  . hydrochlorothiazide (HYDRODIURIL) 25 MG tablet Take 1 tablet (25 mg total) by mouth daily. 90 tablet 3  . meloxicam (MOBIC) 7.5 MG tablet      No current facility-administered medications on file prior to visit.    Review of Systems  Constitutional: Negative for fever, chills, activity change, appetite change and unexpected weight change.  HENT: Positive for trouble swallowing. Negative for ear discharge, ear pain, facial swelling, mouth sores, nosebleeds, postnasal drip, rhinorrhea, sinus pressure, sore throat and voice change.   Respiratory: Positive for choking. Negative for cough.   Cardiovascular: Negative for chest pain.  Gastrointestinal: Negative for nausea, vomiting, abdominal pain, blood in stool and anal bleeding.       Heart burn.    Musculoskeletal: Negative for neck pain and neck stiffness.  Skin: Negative for color change and rash.       Objective:  BP 114/70 mmHg  Pulse 80  Temp(Src) 98.8 F (37.1 C) (Oral)  Resp 18  Ht 5' 6.5" (1.689 m)  Wt 162 lb 6.4 oz (73.664 kg)  BMI 25.82 kg/m2  SpO2 98%  Physical Exam  Constitutional: She is oriented to person, place, and time. She appears well-developed and well-nourished. No distress.  HENT:  Head: Normocephalic and atraumatic.  Mouth/Throat: Oropharynx is clear and moist. No oropharyngeal exudate.  Eyes: Pupils are equal, round, and reactive to light.  Neck: Neck supple. No thyromegaly present.  Cardiovascular: Normal rate, regular rhythm and normal heart sounds.   No murmur heard. Normal S1, S2.  Pulmonary/Chest: Effort normal and breath sounds normal. No respiratory distress. She has no wheezes.  Musculoskeletal: Normal range of motion.  Lymphadenopathy:    She has no cervical adenopathy.  Neurological: She is alert and oriented to person, place, and time. Coordination normal.  Skin: Skin is warm and dry. She is not diaphoretic.  Psychiatric: She has a normal mood and affect. Her behavior is normal.  Nursing note and vitals reviewed.     Assessment & Plan:   1. Dysphagia   2. Gastroesophageal reflux disease, esophagitis presence not specified   Poss esophageal stricture - refer to GI to see if needs dilatation upon pt's request.  Orders Placed  This Encounter  Procedures  . H. pylori breath test  . Ambulatory referral to Gastroenterology    Referral Priority:  Routine    Referral Type:  Consultation    Referral Reason:  Specialty Services Required    Number of Visits Requested:  1    Meds ordered this encounter  Medications  . omeprazole (PRILOSEC) 40 MG capsule    Sig: Take 1 capsule (40 mg total) by mouth daily. 30 minutes before dinner    Dispense:  30 capsule    Refill:  1    I personally performed the services described in this  documentation, which was scribed in my presence. The recorded information has been reviewed and considered, and addended by me as needed.  Delman Cheadle, MD MPH

## 2015-09-16 NOTE — Patient Instructions (Addendum)
IF you received an x-ray today, you will receive an invoice from [facility]. Please contact [facility] at [phone] with questions or concerns regarding your invoice.   IF you received labwork today, you will receive an invoice from [employer]. Please contact [facility] at [phone] with questions or concerns regarding your invoice.   Our billing staff will not be able to assist you with questions regarding bills from these companies.  You will be contacted with the lab results as soon as they are available. The fastest way to get your results is to activate your My Chart account. Instructions are located on the last page of this paperwork. If you have not heard from [place] regarding the results in 2 weeks, please contact this office.    Dysphagia Swallowing problems (dysphagia) occur when solids and liquids seem to stick in your throat on the way down to your stomach, or the food takes longer to get to the stomach. Other symptoms include regurgitating food, noises coming from the throat, chest discomfort with swallowing, and a feeling of fullness or the feeling of something being stuck in your throat when swallowing. When blockage in your throat is complete, it may be associated with drooling. CAUSES  Problems with swallowing may occur because of problems with the muscles. The food cannot be propelled in the usual manner into your stomach. You may have ulcers, scar tissue, or inflammation in the tube down which food travels from your mouth to your stomach (esophagus), which blocks food from passing normally into the stomach. Causes of inflammation include:  Acid reflux from your stomach into your esophagus.  Infection.  Radiation treatment for cancer.  Medicines taken without enough fluids to wash them down into your stomach. You may have nerve problems that prevent signals from being sent to the muscles of your esophagus to contract and  move your food down to your stomach. Globus pharyngeus is a relatively common problem in which there is a sense of an obstruction or difficulty in swallowing, without any physical abnormalities of the swallowing passages being found. This problem usually improves over time with reassurance and testing to rule out other causes. DIAGNOSIS Dysphagia can be diagnosed and its cause can be determined by tests in which you swallow a white substance that helps illuminate the inside of your throat (contrast medium) while X-rays are taken. Sometimes a flexible telescope that is inserted down your throat (endoscopy) to look at your esophagus and stomach is used. TREATMENT   If the dysphagia is caused by acid reflux or infection, medicines may be used.  If the dysphagia is caused by problems with your swallowing muscles, swallowing therapy may be used to help you strengthen your swallowing muscles.  If the dysphagia is caused by a blockage or mass, procedures to remove the blockage may be done. HOME CARE INSTRUCTIONS  Try to eat soft food that is easier to swallow and check your weight on a daily basis to be sure that it is not decreasing.  Be sure to drink liquids when sitting upright (not lying down). SEEK MEDICAL CARE IF:  You are losing weight because you are unable to swallow.  You are coughing when you drink liquids (aspiration).  You are coughing up partially digested food. SEEK IMMEDIATE MEDICAL CARE IF:  You are unable to swallow your own saliva .  You are having shortness of breath or a fever, or both.  You have a hoarse voice along with difficulty swallowing.  MAKE SURE YOU:  Understand these instructions.  Will watch your condition.  Will get help right away if you are not doing well or get worse.   This information is not intended to replace advice given to you by your health care provider. Make sure you discuss any questions you have with your health care provider.   Document  Released: 06/06/2000 Document Revised: 06/30/2014 Document Reviewed: 11/26/2012 Elsevier Interactive Patient Education 2016 [place]. Gastroesophageal Reflux Disease, Adult Normally, food travels down the esophagus and stays in the stomach to be digested. However, when a person has gastroesophageal reflux disease (GERD), food and stomach acid move back up into the esophagus. When this happens, the esophagus becomes sore and inflamed. Over time, GERD can create small holes (ulcers) in the lining of the esophagus.  CAUSES This condition is caused by a problem with the muscle between the esophagus and the stomach (lower esophageal sphincter, or LES). Normally, the LES muscle closes after food passes through the esophagus to the stomach. When the LES is weakened or abnormal, it does not close properly, and that allows food and stomach acid to go back up into the esophagus. The LES can be weakened by certain dietary substances, medicines, and medical conditions, including:  Tobacco use.  Pregnancy.  Having a hiatal hernia.  Heavy alcohol use.  Certain foods and beverages, such as coffee, chocolate, onions, and peppermint. RISK FACTORS This condition is more likely to develop in:  People who have an increased body weight.  People who have connective tissue disorders.  People who use NSAID medicines. SYMPTOMS Symptoms of this condition include:  Heartburn.  Difficult or painful swallowing.  The feeling of having a lump in the throat.  Abitter taste in the mouth.  Bad breath.  Having a large amount of saliva.  Having an upset or bloated stomach.  Belching.  Chest pain.  Shortness of breath or wheezing.  Ongoing (chronic) cough or a night-time cough.  Wearing away of tooth enamel.  Weight loss. Different conditions can cause chest pain. Make sure to see your health care provider if you experience chest pain. DIAGNOSIS Your health care provider will take a medical  history and perform a physical exam. To determine if you have mild or severe GERD, your health care provider may also monitor how you respond to treatment. You may also have other tests, including:  An endoscopy toexamine your stomach and esophagus with a small camera.  A test thatmeasures the acidity level in your esophagus.  A test thatmeasures how much pressure is on your esophagus.  A barium swallow or modified barium swallow to show the shape, size, and functioning of your esophagus. TREATMENT The goal of treatment is to help relieve your symptoms and to prevent complications. Treatment for this condition may vary depending on how severe your symptoms are. Your health care provider may recommend:  Changes to your diet.  Medicine.  Surgery. HOME CARE INSTRUCTIONS Diet  Follow a diet as recommended by your health care provider. This may involve avoiding foods and drinks such as:  Coffee and tea (with or without caffeine).  Drinks that containalcohol.  Energy drinks and sports drinks.  Carbonated drinks or sodas.  Chocolate and cocoa.  Peppermint and mint flavorings.  Garlic and onions.  Horseradish.  Spicy and acidic foods, including peppers, chili powder, curry powder, vinegar, hot sauces, and barbecue sauce.  Citrus fruit juices and citrus fruits, such as oranges, lemons, and limes.  Tomato-based foods, such as red  sauce, chili, salsa, and pizza with red sauce.  Fried and fatty foods, such as donuts, french fries, potato chips, and high-fat dressings.  High-fat meats, such as hot dogs and fatty cuts of red and white meats, such as rib eye steak, sausage, ham, and bacon.  High-fat dairy items, such as whole milk, butter, and cream cheese.  Eat small, frequent meals instead of large meals.  Avoid drinking large amounts of liquid with your meals.  Avoid eating meals during the 2-3 hours before bedtime.  Avoid lying down right after you eat.  Do not  exercise right after you eat. General Instructions  Pay attention to any changes in your symptoms.  Take over-the-counter and prescription medicines only as told by your health care provider. Do not take aspirin, ibuprofen, or other NSAIDs unless your health care provider told you to do so.  Do not use any tobacco products, including cigarettes, chewing tobacco, and e-cigarettes. If you need help quitting, ask your health care provider.  Wear loose-fitting clothing. Do not wear anything tight around your waist that causes pressure on your abdomen.  Raise (elevate) the head of your bed 6 inches (15cm).  Try to reduce your stress, such as with yoga or meditation. If you need help reducing stress, ask your health care provider.  If you are overweight, reduce your weight to an amount that is healthy for you. Ask your health care provider for guidance about a safe weight loss goal.  Keep all follow-up visits as told by your health care provider. This is important. SEEK MEDICAL CARE IF:  You have new symptoms.  You have unexplained weight loss.  You have difficulty swallowing, or it hurts to swallow.  You have wheezing or a persistent cough.  Your symptoms do not improve with treatment.  You have a hoarse voice. SEEK IMMEDIATE MEDICAL CARE IF:  You have pain in your arms, neck, jaw, teeth, or back.  You feel sweaty, dizzy, or light-headed.  You have chest pain or shortness of breath.  You vomit and your vomit looks like blood or coffee grounds.  You faint.  Your stool is bloody or black.  You cannot swallow, drink, or eat.   This information is not intended to replace advice given to you by your health care provider. Make sure you discuss any questions you have with your health care provider.   Document Released: 03/19/2005 Document Revised: 02/28/2015 Document Reviewed: 10/04/2014 Elsevier Interactive Patient Education 2016 [place] for  Gastroesophageal Reflux Disease, Adult When you have gastroesophageal reflux disease (GERD), the foods you eat and your eating habits are very important. Choosing the right foods can help ease the discomfort of GERD. WHAT GENERAL GUIDELINES DO I NEED TO FOLLOW?  Choose fruits, vegetables, whole grains, low-fat dairy products, and low-fat meat, fish, and poultry.  Limit fats such as oils, salad dressings, butter, nuts, and avocado.  Keep a food diary to identify foods that cause symptoms.  Avoid foods that cause reflux. These may be different for different people.  Eat frequent small meals instead of three large meals each day.  Eat your meals slowly, in a relaxed setting.  Limit fried foods.  Cook foods using methods other than frying.  Avoid drinking alcohol.  Avoid drinking large amounts of liquids with your meals.  Avoid bending over or lying down until 2-3 hours after eating. WHAT FOODS ARE NOT RECOMMENDED? The following are some foods and drinks that may worsen your symptoms: Vegetables Tomatoes.  Tomato juice. Tomato and spaghetti sauce. Chili peppers. Onion and garlic. Horseradish. Fruits Oranges, grapefruit, and lemon (fruit and juice). Meats High-fat meats, fish, and poultry. This includes hot dogs, ribs, ham, sausage, salami, and bacon. Dairy Whole milk and chocolate milk. Sour cream. Cream. Butter. Ice cream. Cream cheese.  Beverages Coffee and tea, with or without caffeine. Carbonated beverages or energy drinks. Condiments Hot sauce. Barbecue sauce.  Sweets/Desserts Chocolate and cocoa. Donuts. Peppermint and spearmint. Fats and Oils High-fat foods, including [place] fries and potato chips. Other Vinegar. Strong spices, such as black pepper, white pepper, red pepper, cayenne, curry powder, cloves, ginger, and chili powder. The items listed above may not be a complete list of foods and beverages to avoid. Contact your dietitian for more information.   This  information is not intended to replace advice given to you by your health care provider. Make sure you discuss any questions you have with your health care provider.   Document Released: 06/09/2005 Document Revised: 06/30/2014 Document Reviewed: 04/13/2013 Elsevier Interactive Patient Education [employer].

## 2015-09-17 LAB — H. PYLORI BREATH TEST: H. PYLORI BREATH TEST: NOT DETECTED

## 2015-09-18 ENCOUNTER — Encounter: Payer: Self-pay | Admitting: Family Medicine

## 2015-11-23 ENCOUNTER — Other Ambulatory Visit: Payer: Self-pay | Admitting: Physician Assistant

## 2015-11-23 ENCOUNTER — Telehealth: Payer: Self-pay

## 2015-11-23 MED ORDER — HYDROCHLOROTHIAZIDE 25 MG PO TABS: 25.0000 mg | ORAL_TABLET | Freq: Every day | ORAL | Status: DC

## 2015-11-23 NOTE — Telephone Encounter (Signed)
Patient has been out of her blood pressure medication for 4 days now and is calling to follow up on the status. I informed patient that we never received the faxes that were sent this week. Please advise!

## 2015-11-23 NOTE — Telephone Encounter (Signed)
Pt is looking for a refill for her blood pressure meds little over a week ago her pharmacy has stated that they faxed it several times already   Best number [phone]

## 2015-11-23 NOTE — Telephone Encounter (Signed)
We have not gotten reqs either by computer or fax. I called pt and advised of this and that I will send in 1 mos RF, and that she needs HTN f/up for more. Pt agreed.

## 2015-12-21 ENCOUNTER — Other Ambulatory Visit: Payer: Self-pay | Admitting: Physician Assistant

## 2015-12-21 ENCOUNTER — Ambulatory Visit (INDEPENDENT_AMBULATORY_CARE_PROVIDER_SITE_OTHER): Payer: 59 | Admitting: Physician Assistant

## 2015-12-21 VITALS — BP 136/80 | HR 72 | Temp 98.1°F | Resp 16 | Ht 66.5 in | Wt 172.0 lb

## 2015-12-21 DIAGNOSIS — I10 Essential (primary) hypertension: Secondary | ICD-10-CM | POA: Diagnosis not present

## 2015-12-21 MED ORDER — HYDROCHLOROTHIAZIDE 25 MG PO TABS: 25.0000 mg | ORAL_TABLET | Freq: Every day | ORAL | Status: DC

## 2015-12-21 NOTE — Progress Notes (Signed)
[facility] [address] [place] [address] [phone]  Date:  12/21/2015   Name:  Audrey Wells   DOB:  03-12-1956   MRN:  [id number]  PCP:  PROVIDER NOT IN SYSTEM    History of Present Illness:  Audrey Wells is a 60 y.o. female patient who presents to [facility] for bp recheck. She has been checking her bp at her office around 130/80. No chest pains, palipations, leg sweelling, vision changes.   No side effects with medicaiton. She attempts to avoid salty foods when she can.    Wt Readings from Last 3 Encounters:  12/21/15 172 lb (78.019 kg)  09/16/15 162 lb 6.4 oz (73.664 kg)  11/02/14 168 lb 6 oz (76.374 kg)     There are no active problems to display for this patient.   Past Medical History  Diagnosis Date  . Hypertension     Past Surgical History  Procedure Laterality Date  . Cesarean section    . Tubal ligation      Social History  Substance Use Topics  . Smoking status: Never Smoker   . Smokeless tobacco: Never Used  . Alcohol Use: No    Family History  Problem Relation Age of Onset  . Arthritis Mother   . Hypertension Father   . Hypertension Brother     No Known Allergies  Medication list has been reviewed and updated.  Current Outpatient Prescriptions on File Prior to Visit  Medication Sig Dispense Refill  . hydrochlorothiazide (HYDRODIURIL) 25 MG tablet Take 1 tablet (25 mg total) by mouth daily. 30 tablet 0  . meloxicam (MOBIC) 7.5 MG tablet      No current facility-administered medications on file prior to visit.    ROS ROS otherwise unremarkable unless listed above.   Physical Examination: BP 136/80 mmHg  Pulse 72  Temp(Src) 98.1 F (36.7 C) (Oral)  Resp 16  Ht 5' 6.5" (1.689 m)  Wt 172 lb (78.019 kg)  BMI 27.35 kg/m2  SpO2 100% Ideal Body Weight: Weight in (lb) to have BMI = 25: 156.9  Physical Exam  Constitutional: She is oriented to person, place, and time. She appears well-developed and  well-nourished. No distress.  HENT:  Head: Normocephalic and atraumatic.  Right Ear: External ear normal.  Left Ear: External ear normal.  Eyes: Conjunctivae and EOM are normal. Pupils are equal, round, and reactive to light.  Cardiovascular: Normal rate and regular rhythm.  Exam reveals no friction rub.   No murmur heard. Pulses:      Carotid pulses are 2+ on the right side, and 2+ on the left side.      Radial pulses are 2+ on the right side, and 2+ on the left side.       Dorsalis pedis pulses are 2+ on the right side, and 2+ on the left side.  Pulmonary/Chest: Effort normal. No respiratory distress. She has no wheezes.  Neurological: She is alert and oriented to person, place, and time.  Skin: She is not diaphoretic.  Psychiatric: She has a normal mood and affect. Her behavior is normal.     Assessment and Plan: Audrey Wells is a 60 y.o. female who is here today for bp recheck. Refilling the hydrochlorithiazide at this time.   Given dash diet. Advised to schedule pe in 6 months.   Essential hypertension - Plan: COMPLETE METABOLIC PANEL WITH GFR, Lipid panel, hydrochlorothiazide (HYDRODIURIL) 25 MG tablet   Ivar Drape, PA-C  [facility] 12/21/2015 6:53 PM

## 2015-12-21 NOTE — Patient Instructions (Addendum)
IF you received an x-ray today, you will receive an invoice from [facility]. Please contact [facility] at [phone] with questions or concerns regarding your invoice.   IF you received labwork today, you will receive an invoice from [employer]. Please contact [facility] at [phone] with questions or concerns regarding your invoice.   Our billing staff will not be able to assist you with questions regarding bills from these companies.  You will be contacted with the lab results as soon as they are available. The fastest way to get your results is to activate your My Chart account. Instructions are located on the last page of this paperwork. If you have not heard from [place] regarding the results in 2 weeks, please contact this office.   Please schedule physical exam in the next 6 months.   We are changing to calling ahead at 4 pm the day before and scheduling for "walk-in appointment", so consider that option in getting in to the offices without the wait.  DASH Eating Plan DASH stands for "Dietary Approaches to Stop Hypertension." The DASH eating plan is a healthy eating plan that has been shown to reduce high blood pressure (hypertension). Additional health benefits may include reducing the risk of type 2 diabetes mellitus, heart disease, and stroke. The DASH eating plan may also help with weight loss. WHAT DO I NEED TO KNOW ABOUT THE DASH EATING PLAN? For the DASH eating plan, you will follow these general guidelines:  Choose foods with a percent daily value for sodium of less than 5% (as listed on the food label).  Use salt-free seasonings or herbs instead of table salt or sea salt.  Check with your health care provider or pharmacist before using salt substitutes.  Eat lower-sodium products, often labeled as "lower sodium" or "no salt added."  Eat fresh foods.  Eat more vegetables, fruits, and low-fat dairy products.  Choose whole  grains. Look for the word "whole" as the first word in the ingredient list.  Choose fish and skinless chicken or [place] more often than red meat. Limit fish, poultry, and meat to 6 oz (170 g) each day.  Limit sweets, desserts, sugars, and sugary drinks.  Choose heart-healthy fats.  Limit cheese to 1 oz (28 g) per day.  Eat more home-cooked food and less restaurant, buffet, and fast food.  Limit fried foods.  Cook foods using methods other than frying.  Limit canned vegetables. If you do use them, rinse them well to decrease the sodium.  When eating at a restaurant, ask that your food be prepared with less salt, or no salt if possible. WHAT FOODS CAN I EAT? Seek help from a dietitian for individual calorie needs. Grains Whole grain or whole wheat bread. Brown rice. Whole grain or whole wheat pasta. Quinoa, bulgur, and whole grain cereals. Low-sodium cereals. Corn or whole wheat flour tortillas. Whole grain cornbread. Whole grain crackers. Low-sodium crackers. Vegetables Fresh or frozen vegetables (raw, steamed, roasted, or grilled). Low-sodium or reduced-sodium tomato and vegetable juices. Low-sodium or reduced-sodium tomato sauce and paste. Low-sodium or reduced-sodium canned vegetables.  Fruits All fresh, canned (in natural juice), or frozen fruits. Meat and Other Protein Products Ground beef (85% or leaner), grass-fed beef, or beef trimmed of fat. Skinless chicken or [place]. Ground chicken or [place]. Pork trimmed of fat. All fish and seafood. Eggs. Dried beans, peas, or lentils. Unsalted nuts and seeds. Unsalted canned beans. Dairy Low-fat dairy products, such as skim or 1%  milk, 2% or reduced-fat cheeses, low-fat ricotta or cottage cheese, or plain low-fat yogurt. Low-sodium or reduced-sodium cheeses. Fats and Oils Tub margarines without trans fats. Light or reduced-fat mayonnaise and salad dressings (reduced sodium). Avocado. Safflower, olive, or canola oils. Natural peanut or  almond butter. Other Unsalted popcorn and pretzels. The items listed above may not be a complete list of recommended foods or beverages. Contact your dietitian for more options. WHAT FOODS ARE NOT RECOMMENDED? Grains White bread. White pasta. White rice. Refined cornbread. Bagels and croissants. Crackers that contain trans fat. Vegetables Creamed or fried vegetables. Vegetables in a cheese sauce. Regular canned vegetables. Regular canned tomato sauce and paste. Regular tomato and vegetable juices. Fruits Dried fruits. Canned fruit in light or heavy syrup. Fruit juice. Meat and Other Protein Products Fatty cuts of meat. Ribs, chicken wings, bacon, sausage, bologna, salami, chitterlings, fatback, hot dogs, bratwurst, and packaged luncheon meats. Salted nuts and seeds. Canned beans with salt. Dairy Whole or 2% milk, cream, half-and-half, and cream cheese. Whole-fat or sweetened yogurt. Full-fat cheeses or blue cheese. Nondairy creamers and whipped toppings. Processed cheese, cheese spreads, or cheese curds. Condiments Onion and garlic salt, seasoned salt, table salt, and sea salt. Canned and packaged gravies. Worcestershire sauce. Tartar sauce. Barbecue sauce. Teriyaki sauce. Soy sauce, including reduced sodium. Steak sauce. Fish sauce. Oyster sauce. Cocktail sauce. Horseradish. Ketchup and mustard. Meat flavorings and tenderizers. Bouillon cubes. Hot sauce. Tabasco sauce. Marinades. Taco seasonings. Relishes. Fats and Oils Butter, stick margarine, lard, shortening, ghee, and bacon fat. Coconut, palm kernel, or palm oils. Regular salad dressings. Other Pickles and olives. Salted popcorn and pretzels. The items listed above may not be a complete list of foods and beverages to avoid. Contact your dietitian for more information. WHERE CAN I FIND MORE INFORMATION? National Heart, Lung, and Blood Institute: travelstabloid.com   This information is not intended to  replace advice given to you by your health care provider. Make sure you discuss any questions you have with your health care provider.   Document Released: 05/29/2011 Document Revised: 06/30/2014 Document Reviewed: 04/13/2013 Elsevier Interactive Patient Education [employer].

## 2015-12-22 LAB — COMPLETE METABOLIC PANEL WITH GFR
ALT: 15 U/L (ref 6–29)
AST: 22 U/L (ref 10–35)
Albumin: 4.5 g/dL (ref 3.6–5.1)
Alkaline Phosphatase: 68 U/L (ref 33–130)
BUN: 9 mg/dL (ref 7–25)
CHLORIDE: 101 mmol/L (ref 98–110)
CO2: 30 mmol/L (ref 20–31)
CREATININE: 0.67 mg/dL (ref 0.50–0.99)
Calcium: 8.9 mg/dL (ref 8.6–10.4)
GFR, Est African American: >89 mL/min (ref >=60)
GFR, Est Non African American: >89 mL/min (ref >=60)
GLUCOSE: 90 mg/dL (ref 65–99)
POTASSIUM: 3.7 mmol/L (ref 3.5–5.3)
Sodium: 140 mmol/L (ref 135–146)
Total Bilirubin: 0.4 mg/dL (ref 0.2–1.2)
Total Protein: 7.7 g/dL (ref 6.1–8.1)

## 2015-12-22 LAB — LIPID PANEL
CHOL/HDL RATIO: 4 ratio (ref <=5.0)
CHOLESTEROL: 247 mg/dL — AB (ref 125–200)
HDL: 61 mg/dL (ref >=46)
LDL CALC: 162 mg/dL — AB (ref <130)
Triglycerides: 120 mg/dL (ref <150)
VLDL: 24 mg/dL (ref <30)

## 2016-01-04 ENCOUNTER — Telehealth: Payer: Self-pay

## 2016-01-04 NOTE — Telephone Encounter (Signed)
Patient was recently seen for high cholesterol and was advised to take omega 3. Patient states that it works but she also takes caltrate and it work just the same. Patient wants to know if she should take both or just one. Please advise! [phone]

## 2016-01-10 NOTE — Telephone Encounter (Signed)
These different supplements do very different things, unless this was written incorrectly.  Please take the omega 3, and if you would like to continue the caltrate (calcium carbonate) you can.

## 2016-01-11 NOTE — Telephone Encounter (Signed)
Left message for pt to call back  °

## 2016-01-15 NOTE — Telephone Encounter (Signed)
Pt advised.

## 2016-07-17 DIAGNOSIS — Z01419 Encounter for gynecological examination (general) (routine) without abnormal findings: Secondary | ICD-10-CM | POA: Diagnosis not present

## 2016-07-21 DIAGNOSIS — R3121 Asymptomatic microscopic hematuria: Secondary | ICD-10-CM | POA: Diagnosis not present

## 2016-07-21 DIAGNOSIS — R350 Frequency of micturition: Secondary | ICD-10-CM | POA: Diagnosis not present

## 2016-12-13 ENCOUNTER — Other Ambulatory Visit: Payer: Self-pay | Admitting: Physician Assistant

## 2016-12-13 DIAGNOSIS — I10 Essential (primary) hypertension: Secondary | ICD-10-CM

## 2016-12-21 ENCOUNTER — Other Ambulatory Visit: Payer: Self-pay | Admitting: Physician Assistant

## 2016-12-21 DIAGNOSIS — I10 Essential (primary) hypertension: Secondary | ICD-10-CM

## 2016-12-23 ENCOUNTER — Ambulatory Visit: Payer: 59 | Admitting: Physician Assistant

## 2016-12-23 ENCOUNTER — Other Ambulatory Visit: Payer: Self-pay | Admitting: Physician Assistant

## 2016-12-23 DIAGNOSIS — I10 Essential (primary) hypertension: Secondary | ICD-10-CM

## 2017-01-24 ENCOUNTER — Ambulatory Visit (INDEPENDENT_AMBULATORY_CARE_PROVIDER_SITE_OTHER): Payer: 59 | Admitting: Physician Assistant

## 2017-01-24 ENCOUNTER — Encounter: Payer: Self-pay | Admitting: Physician Assistant

## 2017-01-24 DIAGNOSIS — E7800 Pure hypercholesterolemia, unspecified: Secondary | ICD-10-CM | POA: Insufficient documentation

## 2017-01-24 DIAGNOSIS — I10 Essential (primary) hypertension: Secondary | ICD-10-CM

## 2017-01-24 MED ORDER — HYDROCHLOROTHIAZIDE 25 MG PO TABS: 25.0000 mg | ORAL_TABLET | Freq: Every day | ORAL | 1 refills | Status: DC

## 2017-01-24 NOTE — Progress Notes (Signed)
   Audrey Wells  MRN: [id number] DOB: 01/18/1956  PCP: System, Provider Not In  Chief Complaint  Patient presents with  . Hypertension    follow-up    Subjective:  Pt presents to clinic for medication refills.  She tolerates the medication well.  She does not find that she has increased urination.  She has not really changed her diet much since her elevated cholesterol levels 6/17 but she feels like she eats pretty healthy and exercises 2x/weekly  BP at home - 130/80 - in the am   History is obtained by patient.  Review of Systems  Constitutional: Negative for chills and fever.  Eyes: Negative for visual disturbance.  Respiratory: Negative for shortness of breath.   Cardiovascular: Negative for chest pain, palpitations and leg swelling.  Neurological: Negative for headaches.    Patient Active Problem List   Diagnosis Date Noted  . HTN (hypertension) 01/24/2017  . Elevated LDL cholesterol level 01/24/2017    Current Outpatient Prescriptions on File Prior to Visit  Medication Sig Dispense Refill  . meloxicam (MOBIC) 7.5 MG tablet      No current facility-administered medications on file prior to visit.     No Known Allergies  Past Medical History:  Diagnosis Date  . Hypertension    Social History   Social History Narrative   Lives with her husband and their 2 adult children.      Seat belt - 100%   Exercise - 3x/week for 1 hour   Social History  Substance Use Topics  . Smoking status: Never Smoker  . Smokeless tobacco: Never Used  . Alcohol use No   family history includes Arthritis in her mother; Hypertension in her brother and father.     Objective:  BP 130/78   Pulse 72   Temp 98 F (36.7 C) (Oral)   Resp 18   Ht 5' 6.53" (1.69 m)   Wt 164 lb (74.4 kg)   SpO2 98%   BMI 26.05 kg/m  Body mass index is 26.05 kg/m.  Physical Exam  Constitutional: She is oriented to person, place, and time and well-developed, well-nourished, and in no  distress.  HENT:  Head: Normocephalic and atraumatic.  Right Ear: Hearing and external ear normal.  Left Ear: Hearing and external ear normal.  Eyes: Conjunctivae are normal.  Neck: Normal range of motion.  Cardiovascular: Normal rate, regular rhythm and normal heart sounds.   No murmur heard. Pulmonary/Chest: Effort normal and breath sounds normal. She has no wheezes.  Musculoskeletal:       Right lower leg: She exhibits no edema.       Left lower leg: She exhibits no edema.  Neurological: She is alert and oriented to person, place, and time. Gait normal.  Skin: Skin is warm and dry.  Psychiatric: Mood, memory, affect and judgment normal.  Vitals reviewed.   Assessment and Plan :  Essential hypertension - Plan: CMP14+EGFR, hydrochlorothiazide (HYDRODIURIL) 25 MG tablet  Elevated LDL cholesterol level - Plan: Lipid panel   Continue medications - pt will monitor BP at different time of the day to make sure she has good BP control all 24h.  I will contact her through a letter with her lab results.  Plan will be 6 month recheck but if her cholesterol needs treatment she may need a sooner appt.  Windell Hummingbird PA-C  [facility] 01/24/2017 11:59 AM

## 2017-01-25 LAB — CMP14+EGFR
A/G RATIO: 1.4 (ref 1.2–2.2)
ALT: 10 IU/L (ref 0–32)
AST: 17 IU/L (ref 0–40)
Albumin: 4.3 g/dL (ref 3.6–4.8)
Alkaline Phosphatase: 79 IU/L (ref 39–117)
BILIRUBIN TOTAL: 0.3 mg/dL (ref 0.0–1.2)
BUN / CREAT RATIO: 11 — AB (ref 12–28)
BUN: 8 mg/dL (ref 8–27)
CALCIUM: 9.7 mg/dL (ref 8.7–10.3)
CHLORIDE: 102 mmol/L (ref 96–106)
CO2: 26 mmol/L (ref 20–29)
Creatinine, Ser: 0.7 mg/dL (ref 0.57–1.00)
GFR, EST AFRICAN AMERICAN: 107 mL/min/{1.73_m2} (ref >59)
GFR, EST NON AFRICAN AMERICAN: 93 mL/min/{1.73_m2} (ref >59)
Globulin, Total: 3 g/dL (ref 1.5–4.5)
Glucose: 78 mg/dL (ref 65–99)
POTASSIUM: 3.7 mmol/L (ref 3.5–5.2)
Sodium: 142 mmol/L (ref 134–144)
TOTAL PROTEIN: 7.3 g/dL (ref 6.0–8.5)

## 2017-01-25 LAB — LIPID PANEL
CHOL/HDL RATIO: 4 ratio (ref 0.0–4.4)
Cholesterol, Total: 210 mg/dL — ABNORMAL HIGH (ref 100–199)
HDL: 52 mg/dL (ref >39)
LDL Calculated: 133 mg/dL — ABNORMAL HIGH (ref 0–99)
Triglycerides: 123 mg/dL (ref 0–149)
VLDL Cholesterol Cal: 25 mg/dL (ref 5–40)

## 2017-02-02 ENCOUNTER — Ambulatory Visit (HOSPITAL_COMMUNITY)
Admission: EM | Admit: 2017-02-02 | Discharge: 2017-02-02 | Disposition: A | Discharge status: Home / Self Care | Payer: 59 | Attending: Emergency Medicine | Admitting: Emergency Medicine

## 2017-02-02 ENCOUNTER — Encounter (HOSPITAL_COMMUNITY): Payer: Self-pay | Admitting: *Deleted

## 2017-02-02 DIAGNOSIS — M25562 Pain in left knee: Secondary | ICD-10-CM | POA: Diagnosis not present

## 2017-02-02 DIAGNOSIS — M76892 Other specified enthesopathies of left lower limb, excluding foot: Secondary | ICD-10-CM | POA: Diagnosis not present

## 2017-02-02 NOTE — Discharge Instructions (Signed)
Apply ice to the area pain to the knee at least 2-3 times a day if possible. Especially after work. We are the knee sleeve that you have and limit the amount of bending at work. Limit the amount of walking as much as possible. You may continue taking your Modic for pain. Rest ice and limiting flexion and extension is the best treatment for this.

## 2017-02-02 NOTE — ED Triage Notes (Signed)
Knee     Pain    X   3  Days    Denies   Any  specefivc  Injury    Pain  l  Knee   needele  Like   Sensations   Feels  Heavy          When  She  Walks

## 2017-02-02 NOTE — ED Provider Notes (Signed)
[facility]    CSN: [id number] Arrival date & time: 02/02/17  1854     History   Chief Complaint Chief Complaint  Patient presents with  . Knee Pain    HPI Audrey Wells is a 61 y.o. female.   61 year old female is complaining of left knee pain for 3 days. Denies any known injury, no twisting, strain, blunt trauma or other known injury. She states it started rather suddenly 3 days ago. The more she walks on the knee the more it hurts. 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 This is when the pain is the greatest. This weekend she has been applying ice to the area pain and it does help but when she is up walking again the pain returns. She states it feels like needles sticking in her knee as well as soreness.      Past Medical History:  Diagnosis Date  . Hypertension     Patient Active Problem List   Diagnosis Date Noted  . HTN (hypertension) 01/24/2017  . Elevated LDL cholesterol level 01/24/2017    Past Surgical History:  Procedure Laterality Date  . CESAREAN SECTION    . TUBAL LIGATION      OB History    No data available       Home Medications    Prior to Admission medications   Medication Sig Start Date End Date Taking? Authorizing Provider  hydrochlorothiazide (HYDRODIURIL) 25 MG tablet Take 1 tablet (25 mg total) by mouth daily. 01/24/17   Gale Journey, Damaris Hippo, PA-C  meloxicam (MOBIC) 7.5 MG tablet  01/08/11   [provider]    Family History Family History  Problem Relation Age of Onset  . Arthritis Mother   . Hypertension Father   . Hypertension Brother     Social History Social History  Substance Use Topics  . Smoking status: Never Smoker  . Smokeless tobacco: Never Used  . Alcohol use No     Allergies   Patient has no known allergies.   Review of Systems Review of Systems  Constitutional: Negative for activity change, chills and fever.  HENT: Negative.     Respiratory: Negative.   Cardiovascular: Negative.   Musculoskeletal: Negative for joint swelling.       As per HPI  Skin: Negative for color change, pallor and rash.  Neurological: Negative.   All other systems reviewed and are negative.    Physical Exam Triage Vital Signs ED Triage Vitals  Enc Vitals Group     BP 02/02/17 1935 (!) 153/69     Pulse Rate 02/02/17 1935 81     Resp 02/02/17 1935 16     Temp 02/02/17 1935 99.1 F (37.3 C)     Temp Source 02/02/17 1935 Oral     SpO2 02/02/17 1935 100 %     Weight --      Height --      Head Circumference --      Peak Flow --      Pain Score 02/02/17 1940 6     Pain Loc --      Pain Edu? --      Excl. in [place]? --    No data found.   Updated Vital Signs BP (!) 153/69 (BP Location: Left Arm)   Pulse 81   Temp 99.1 F (37.3 C) (Oral)   Resp 16   SpO2 100%   Visual Acuity  Right Eye Distance:   Left Eye Distance:   Bilateral Distance:    Right Eye Near:   Left Eye Near:    Bilateral Near:     Physical Exam  Constitutional: She is oriented to person, place, and time. She appears well-developed and well-nourished. No distress.  HENT:  Head: Normocephalic and atraumatic.  Eyes: Pupils are equal, round, and reactive to light. EOM are normal.  Neck: Neck supple.  Musculoskeletal: Normal range of motion.  Left knee with no swelling, no apparent effusion. The patient points to the medial aspect of the knee as a source of pain. The areas of tenderness include the medial tibial and femoral condyles. No joint line tenderness. No lateral tenderness. Extension is intact flexion is intact but feels tight beyond 90. Negative drawer, negative varus, negative valgus, no laxity appreciated. Patella midline and glides smoothly within track.  Neurological: She is alert and oriented to person, place, and time. No cranial nerve deficit.  Skin: Skin is warm and dry.     UC Treatments / Results  Labs (all labs ordered are listed, but  only abnormal results are displayed) Labs Reviewed - No data to display  EKG  EKG Interpretation None       Radiology No results found.  Procedures Procedures (including critical care time)  Medications Ordered in UC Medications - No data to display   Initial Impression / Assessment and Plan / UC Course  I have reviewed the triage vital signs and the nursing notes.  Pertinent labs & imaging results that were available during my care of the patient were reviewed by me and considered in my medical decision making (see chart for details).    Apply ice to the area pain to the knee at least 2-3 times a day if possible. Especially after work. We are the knee sleeve that you have and limit the amount of bending at work. Limit the amount of walking as much as possible. You may continue taking your Modic for pain. Rest ice and limiting flexion and extension is the best treatment for this.    Final Clinical Impressions(s) / UC Diagnoses   Final diagnoses:  Acute pain of left knee  Tendinitis of left knee    New Prescriptions New Prescriptions   No medications on file     Controlled Substance Prescriptions [place] Controlled Substance Registry consulted? Not Applicable   Janne Napoleon, NP 02/02/17 2023

## 2017-02-02 NOTE — ED Notes (Signed)
[facility]  Applied

## 2017-02-19 ENCOUNTER — Ambulatory Visit (INDEPENDENT_AMBULATORY_CARE_PROVIDER_SITE_OTHER): Payer: 59

## 2017-02-19 ENCOUNTER — Other Ambulatory Visit: Payer: Self-pay | Admitting: Physician Assistant

## 2017-02-19 ENCOUNTER — Ambulatory Visit (INDEPENDENT_AMBULATORY_CARE_PROVIDER_SITE_OTHER): Payer: 59 | Admitting: Physician Assistant

## 2017-02-19 ENCOUNTER — Encounter: Payer: Self-pay | Admitting: Physician Assistant

## 2017-02-19 VITALS — BP 154/79 | HR 78 | Temp 98.3°F | Resp 18 | Ht 66.14 in | Wt 167.2 lb

## 2017-02-19 DIAGNOSIS — M25562 Pain in left knee: Secondary | ICD-10-CM

## 2017-02-19 DIAGNOSIS — R030 Elevated blood-pressure reading, without diagnosis of hypertension: Secondary | ICD-10-CM | POA: Diagnosis not present

## 2017-02-19 DIAGNOSIS — S8992XA Unspecified injury of left lower leg, initial encounter: Secondary | ICD-10-CM | POA: Diagnosis not present

## 2017-02-19 MED ORDER — NAPROXEN 500 MG PO TABS: 500.0000 mg | ORAL_TABLET | Freq: Two times a day (BID) | ORAL | 0 refills | Status: DC

## 2017-02-19 NOTE — Progress Notes (Signed)
KAYANN MAJ  MRN: [id number] DOB: [birth year]  Subjective:  Audrey Wells is a 61 y.o. female who presents with knee pain involving the left knee. Onset was gradual, starting about 3 weeks ago. Inciting event: cannot recall an exact injury but does think she may have hit it on a night stand back in 11/2016. Current symptoms include: stabbing sensation when she applies weight. Pain is located "on the inside of knee." Denies redness, warmth, crepitus sensation, foreign body sensation, giving out, locking, popping sensation, stiffness and swelling. Pain is aggravated by any weight bearing, going up and down stairs and bending. Patient has had no prior knee problems. Evaluation to date: went to urgent care on 02/02/17 and dx with tendinitis, no plain films were obtained. Treatment to date: ice, rest and mobic daily for the last two weeks. Works as a Chief Executive Officer at [employer]. On her feet daily. Wears flat shoes at work with no support.   Of note, pt did not take blood pressure medication this morning.  Review of Systems  Constitutional: Negative for chills, diaphoresis and fever.  Respiratory: Negative for cough and shortness of breath.   Cardiovascular: Negative for chest pain.  Gastrointestinal: Negative for nausea and vomiting.    Patient Active Problem List   Diagnosis Date Noted  . HTN (hypertension) 01/24/2017  . Elevated LDL cholesterol level 01/24/2017    Current Outpatient Prescriptions on File Prior to Visit  Medication Sig Dispense Refill  . hydrochlorothiazide (HYDRODIURIL) 25 MG tablet Take 1 tablet (25 mg total) by mouth daily. 90 tablet 1  . meloxicam (MOBIC) 7.5 MG tablet      No current facility-administered medications on file prior to visit.     No Known Allergies   Objective:  BP (!) 143/76 (BP Location: Left Arm, Patient Position: Sitting, Cuff Size: Normal)   Pulse 78   Temp 98.3 F (36.8 C) (Oral)   Resp 18   Ht 5' 6.14" (1.68 m)   Wt  167 lb 3.2 oz (75.8 kg)   SpO2 99%   BMI 26.87 kg/m   Physical Exam  Constitutional: She is oriented to person, place, and time and well-developed, well-nourished, and in no distress.  HENT:  Head: Normocephalic and atraumatic.  Eyes: Conjunctivae are normal.  Neck: Normal range of motion.  Pulmonary/Chest: Effort normal.  Musculoskeletal:       Left knee: She exhibits decreased range of motion (pain noted wtih full knee flexion). She exhibits no swelling, no effusion, no ecchymosis, no erythema, normal patellar mobility and no bony tenderness. Tenderness (with palpation of suprapatellar region) found. No medial joint line, no lateral joint line, no MCL and no LCL tenderness noted.  Negative Anterior and Posterior Drawer Test Negative McMurray Test Negative valgus and varus strain test.   Neurological: She is alert and oriented to person, place, and time. Gait normal.  Skin: Skin is warm and dry.  Psychiatric: Affect normal.  Vitals reviewed.  Dg Knee Complete 4 Views Left  Result Date: 02/19/2017 CLINICAL DATA:  Left knee pain, injury EXAM: LEFT KNEE - COMPLETE 4+ VIEW COMPARISON:  None available FINDINGS: No evidence of fracture, dislocation, or joint effusion. No evidence of arthropathy or other focal bone abnormality. Soft tissues are unremarkable. IMPRESSION: No acute osseous finding Electronically Signed   By: Jerilynn Mages.  Shick M.D.   On: 02/19/2017 09:57   Assessment and Plan :  1. Acute pain of left knee Plain films are reassuring. Hx and PE findings consistent with  tendonitis. Suspect quadriceps tendonitis or patellofemorall syndrome. Recommended discontinuing meloxicam and starting naproxen BID. Given knee brace in office. Continue rest and ice. Pt encouraged to contact our office if no improvement in 2 weeks, as she would likely benefit from orhtopedic referral at that time if no improvement with current tx plan.  - DG Knee Complete 4 Views Left; Future - Apply other splint -  naproxen (NAPROSYN) 500 MG tablet; Take 1 tablet (500 mg total) by mouth 2 (two) times daily with a meal.  Dispense: 30 tablet; Refill: 0  2. Elevated blood pressure reading Asymptomatic. Instructed to check bp outside of office over the next couple of weeks. Return if consistently >140/90. Given strict ED precautions.   Tenna Delaine PA-C  [facility] 02/19/2017 10:01 AM

## 2017-02-19 NOTE — Patient Instructions (Addendum)
Your xrays were negative for acute fracture, which is great news. I suspect that this is either a tendonitis or overuse injury. I would recommend using a patella knee brace daily for the next couple of weeks while walking around. I also recommend taking daily naproxen. Do not take mobic while on this medication. Once you start having some relief I have given you some info below to help strengthen your quadriceps tendon. I also suggest going to the [employer] and purchasing some shoes that have better support in them since your are on your feet all day at work. I would continue resting and icing the knee while at home. If no improvement with these recommendations in 2 weeks, please contact our office as you may need a referral to orthopedics at that time. Thank you for letting me participate in your health and well being.  Quadriceps Strain Rehab Ask your health care provider which exercises are safe for you. Do exercises exactly as told by your health care provider and adjust them as directed. It is normal to feel mild stretching, pulling, tightness, or discomfort as you do these exercises, but you should stop right away if you feel sudden pain or your pain gets worse.Do not begin these exercises until told by your health care provider. Stretching and range of motion exercises These exercises warm up your muscles and joints and improve the movement and flexibility of your thigh. These exercises can also help to relieve stiffness or swelling. Exercise A: Heel slides  1. Lie on your back with both knees straight. If this causes back discomfort, bend the knee of your healthy leg, placing your foot flat on the floor. 2. Slowly slide your left / right heel back toward your buttocks until you feel a gentle stretch in the front of your knee or thigh. 3. Hold for __________ seconds. Then slowly slide your heel back to the starting position. Repeat __________ times. Complete this exercise __________ times a  day. Exercise B: Quadriceps stretch, prone  1. Lie on your abdomen on a firm surface, such as a bed or padded floor. 2. Bend your left / right knee and hold your ankle. If you cannot reach your ankle or pant leg, loop a belt around your foot and grab the belt instead. 3. Gently pull your heel toward your buttocks. Your knee should not slide out to the side. You should feel a stretch in the front of your thigh and knee. 4. Hold this position for __________ seconds. Repeat __________ times. Complete this exercise __________ times a day. Strengthening exercises These exercises build strength and endurance in your thigh. Endurance is the ability to use your muscles for a long time, even after your muscles get tired. Exercise C: Straight leg raises ( quadriceps and hip flexors) Quality counts! Watch for signs that the quadriceps muscle is working to ensure that you are strengthening the correct muscles and not cheating by using healthier muscles. 1. Lie on your back with your left / right leg extended and your other knee bent. 2. Tense the muscles in the front of your left / right thigh. You should see your kneecap slide up or see increased dimpling just above the knee. 3. Tighten these muscles even more and raise your leg 4-6 inches (10-15 cm) off the floor. 4. Hold for __________ seconds. 5. Keep the thigh muscles tense as you lower your leg. 6. Relax the muscles slowly and completely after each repetition. Repeat __________ times. Complete this exercise __________  times a day. Exercise D: Straight leg raises ( hip extensors) 1. Lie on your belly on a bed or a firm surface with a pillow under your hips. 2. Bend your left / right knee so your foot is straight up in the air. 3. Tense your buttock muscles and lift your left / right thigh off the bed. Do not let your back arch. 4. Hold this position for __________ seconds. 5. Slowly return to the starting position. Let your muscles relax  completely before doing another repetition. Repeat __________ times. Complete this exercise __________ times a day. Exercise E: Wall sits  Follow the directions for form closely. If you do not place your feet and knees properly, this can lead to knee pain. 1. Lean back against a smooth wall or door and walk your feet out 18-24 inches (46-61 cm) from it. Place your feet hip-width apart. 2. Slowly slide down the wall or door until your knees bend __________ degrees. Keep your weight back and over your heels, not over your toes. Keep your thighs straight or pointing slightly outward. 3. Hold for __________ seconds. 4. Use your thigh and buttock muscles to push you back up to a standing position. Keep your weight through your heels while you do this. 5. Rest for __________ seconds in between repetitions. Repeat __________ times. Complete this exercise __________ times a day. This information is not intended to replace advice given to you by your health care provider. Make sure you discuss any questions you have with your health care provider. Document Released: 06/09/2005 Document Revised: 02/14/2016 Document Reviewed: 03/13/2015 Elsevier Interactive Patient Education  2018 [employer].   How to Use a Knee Brace A knee brace is a device that you wear to support your knee, especially if the knee is healing after an injury or surgery. There are several types of knee braces. Some are designed to prevent an injury (prophylactic brace). These are often worn during sports. Others support an injured knee (functional brace) or keep it still while it heals (rehabilitative brace). People with severe arthritis of the knee may benefit from a brace that takes some pressure off the knee (unloader brace). Most knee braces are made from a combination of cloth and metal or plastic. You may need to wear a knee brace to: Relieve knee pain. Help your knee support your weight (improve stability). Help you walk farther  (improve mobility). Prevent injury. Support your knee while it heals from surgery or from an injury.  What are the risks? Generally, knee braces are very safe to wear. However, problems may occur, including: Skin irritation that may lead to infection. Making your condition worse if you wear the brace in the wrong way.  How to use a knee brace Different braces will have different instructions for use. Your health care provider will tell you or show you: How to put on your brace. How to adjust the brace. When and how often to wear the brace. How to remove the brace. If you will need any assistive devices in addition to the brace, such as crutches or a cane.  In general, your brace should: Have the hinge of the brace line up with the bend of your knee. Have straps, hooks, or tapes that fasten snugly around your leg. Not feel too tight or too loose.  How to care for a knee brace Check your brace often for signs of damage, such as loose connections or attachments. Your knee brace may get damaged or wear  out during normal use. Wash the fabric parts of your brace with soap and water. Read the insert that comes with your brace for other specific care instructions. Contact a health care provider if: Your knee brace is too loose or too tight and you cannot adjust it. Your knee brace causes skin redness, swelling, bruising, or irritation. Your knee brace is not helping. Your knee brace is making your knee pain worse. This information is not intended to replace advice given to you by your health care provider. Make sure you discuss any questions you have with your health care provider. Document Released: 08/30/2003 Document Revised: 11/15/2015 Document Reviewed: 10/02/2014 Elsevier Interactive Patient Education  2018 [employer].    IF you received an x-ray today, you will receive an invoice from [facility]. Please contact [facility] at [phone] with questions or  concerns regarding your invoice.   IF you received labwork today, you will receive an invoice from [facility]. Please contact [facility] at [phone] with questions or concerns regarding your invoice.   Our billing staff will not be able to assist you with questions regarding bills from these companies.  You will be contacted with the lab results as soon as they are available. The fastest way to get your results is to activate your My Chart account. Instructions are located on the last page of this paperwork. If you have not heard from [place] regarding the results in 2 weeks, please contact this office.

## 2017-02-19 NOTE — Progress Notes (Deleted)
Subjective:    Audrey Wells is a 61 y.o. female who presents {knee rfv:14258} involving {knee; r/l/both:19542}. Onset was {onset:15951}. Inciting event: {inciting event:14349}. Current symptoms include: {symptoms:14260}. Pain is aggravated by {knee pain aggravating factors:14088}. Patient {has had:32492} prior knee problems. Evaluation to date: {knee pain eval:14090}. Treatment to date: {knee pain tx:14089}.  {Common ambulatory SmartLinks:19316}   Review of Systems {ros; complete:30496}   Objective:    BP (!) 143/76 (BP Location: Left Arm, Patient Position: Sitting, Cuff Size: Normal)   Pulse 78   Temp 98.3 F (36.8 C) (Oral)   Resp 18   Ht 5' 6.14" (1.68 m)   Wt 167 lb 3.2 oz (75.8 kg)   SpO2 99%   BMI 26.87 kg/m  Right knee: {exam; knee:14093::"normal","no effusion, full active range of motion, no joint line tenderness, ligamentous structures intact."}  Left knee:  {exam; knee:14093::"normal","no effusion, full active range of motion, no joint line tenderness, ligamentous structures intact."}   X-ray {knee; r/l/bothL:19510}: {normal:5769::"no fracture, dislocation, swelling or degenerative changes noted"}    Assessment:    {right/Left:5607} {Knee pain dx list:14094}    Plan:    {knee pain tx list:14095}

## 2017-04-07 DIAGNOSIS — Z23 Encounter for immunization: Secondary | ICD-10-CM | POA: Diagnosis not present

## 2017-04-12 ENCOUNTER — Other Ambulatory Visit: Payer: Self-pay | Admitting: Physician Assistant

## 2017-04-12 DIAGNOSIS — M25562 Pain in left knee: Secondary | ICD-10-CM

## 2017-04-12 DIAGNOSIS — I10 Essential (primary) hypertension: Secondary | ICD-10-CM

## 2017-04-13 NOTE — Telephone Encounter (Signed)
I called pt about naproxen not ment to be used long term. Pt states that pharmacy called that medication  in and that she do not need it due to her knee feeling much better

## 2017-04-13 NOTE — Telephone Encounter (Signed)
Please call pt and let her know that naproxen is not meant for long term use. If she is still having knee pain, please return to office.

## 2017-04-13 NOTE — Telephone Encounter (Signed)
Do we need to call the pharmacy and have them d/c this medication for the patient so this does not happen again in the future? Thanks!

## 2017-04-13 NOTE — Telephone Encounter (Signed)
Please advise/refill naproxen (NAPROSYN)

## 2017-04-15 NOTE — Telephone Encounter (Signed)
I called pharmacy and asked them t close it out that the pt will not be needing any refills of this medication at this time

## 2017-08-13 ENCOUNTER — Other Ambulatory Visit: Payer: Self-pay | Admitting: Physician Assistant

## 2017-08-13 DIAGNOSIS — I10 Essential (primary) hypertension: Secondary | ICD-10-CM

## 2017-08-22 ENCOUNTER — Other Ambulatory Visit: Payer: Self-pay

## 2017-08-22 ENCOUNTER — Ambulatory Visit: Payer: 59 | Admitting: Family Medicine

## 2017-08-22 ENCOUNTER — Encounter: Payer: Self-pay | Admitting: Family Medicine

## 2017-08-22 VITALS — BP 152/79 | HR 72 | Temp 98.2°F | Resp 17 | Ht 66.14 in | Wt 178.0 lb

## 2017-08-22 DIAGNOSIS — E785 Hyperlipidemia, unspecified: Secondary | ICD-10-CM | POA: Diagnosis not present

## 2017-08-22 DIAGNOSIS — I10 Essential (primary) hypertension: Secondary | ICD-10-CM

## 2017-08-22 DIAGNOSIS — Z131 Encounter for screening for diabetes mellitus: Secondary | ICD-10-CM

## 2017-08-22 DIAGNOSIS — Z5181 Encounter for therapeutic drug level monitoring: Secondary | ICD-10-CM | POA: Diagnosis not present

## 2017-08-22 DIAGNOSIS — E663 Overweight: Secondary | ICD-10-CM

## 2017-08-22 DIAGNOSIS — M1990 Unspecified osteoarthritis, unspecified site: Secondary | ICD-10-CM | POA: Diagnosis not present

## 2017-08-22 MED ORDER — MELOXICAM 7.5 MG PO TABS: 7.5000 mg | ORAL_TABLET | Freq: Every day | ORAL | 1 refills | Status: DC

## 2017-08-22 MED ORDER — HYDROCHLOROTHIAZIDE 25 MG PO TABS: 25.0000 mg | ORAL_TABLET | Freq: Every day | ORAL | 0 refills | Status: DC

## 2017-08-22 NOTE — Progress Notes (Signed)
Chief Complaint  Patient presents with  . htn and medication check/refills    needs refill on hudrodiuril and meloxicam    HPI  Hypertension: Patient here for follow-up of elevated blood pressure. She is exercising and is adherent to low salt diet.  Blood pressure is well controlled at home. Cardiac symptoms none. Patient denies chest pain, chest pressure/discomfort, claudication, exertional chest pressure/discomfort, fatigue, irregular heart beat, lower extremity edema and near-syncope.  Cardiovascular risk factors: dyslipidemia and hypertension. Use of agents associated with hypertension: NSAIDS. History of target organ damage: none. BP Readings from Last 3 Encounters:  08/22/17 (!) 152/79  02/19/17 (!) 154/79  02/02/17 (!) 153/69   Chronic arthritis She takes meloxicam for arthritis  Joints that are typically affected are her knees bilaterally She states that she takes the medication daily She denies any blood in her bm, no bruising, no unexampled weightloss, no reflux  Overweight  Body mass index is 28.61 kg/m. Wt Readings from Last 3 Encounters:  08/22/17 178 lb (80.7 kg)  02/19/17 167 lb 3.2 oz (75.8 kg)  01/24/17 164 lb (74.4 kg)   She reports that she walks on the treadmill for exercise She states that she could tell her pressure was up with the weight gain Denies stress Mostly eats at home      Past Medical History:  Diagnosis Date  . Hypertension     Current Outpatient Medications  Medication Sig Dispense Refill  . hydrochlorothiazide (HYDRODIURIL) 25 MG tablet Take 1 tablet (25 mg total) by mouth daily. 90 tablet 0  . meloxicam (MOBIC) 7.5 MG tablet Take 1 tablet (7.5 mg total) by mouth daily. 90 tablet 1   No current facility-administered medications for this visit.     Allergies: No Known Allergies  Past Surgical History:  Procedure Laterality Date  . CESAREAN SECTION    . TUBAL LIGATION      Social History   Socioeconomic History  . Marital  status: Married    Spouse name: [name]  . Number of children: 2  . Years of education: 67  . Highest education level: None  Social Needs  . Financial resource strain: None  . Food insecurity - worry: None  . Food insecurity - inability: None  . Transportation needs - medical: None  . Transportation needs - non-medical: None  Occupational History  . Occupation: CNA    Employer: [employer]  Tobacco Use  . Smoking status: Never Smoker  . Smokeless tobacco: Never Used  Substance and Sexual Activity  . Alcohol use: No    Alcohol/week: 0.0 oz  . Drug use: No  . Sexual activity: Yes    Birth control/protection: Post-menopausal, Surgical  Other Topics Concern  . None  Social History Narrative   Lives with her husband and their 2 adult children.      Seat belt - 100%   Exercise - 3x/week for 1 hour    Family History  Problem Relation Age of Onset  . Arthritis Mother   . Hypertension Father   . Hypertension Brother      ROS Review of Systems See HPI Constitution: No fevers or chills No malaise No diaphoresis Skin: No rash or itching Eyes: no blurry vision, no double vision GU: no dysuria or hematuria Neuro: no dizziness or headaches  all others reviewed and negative   Objective: Vitals:   08/22/17 1031  BP: (!) 152/79  Pulse: 72  Resp: 17  Temp: 98.2 F (36.8 C)  TempSrc: Oral  SpO2:  100%  Weight: 178 lb (80.7 kg)  Height: 5' 6.14" (1.68 m)    Physical Exam  Constitutional: She is oriented to person, place, and time. She appears well-developed and well-nourished.  HENT:  Head: Normocephalic and atraumatic.  Eyes: Conjunctivae and EOM are normal.  Cardiovascular: Normal rate, regular rhythm and normal heart sounds.  No murmur heard. Pulmonary/Chest: Effort normal and breath sounds normal. No stridor. No respiratory distress.  Abdominal: Soft. Bowel sounds are normal. She exhibits no distension. There is no tenderness. There is no guarding.    Neurological: She is alert and oriented to person, place, and time.  Skin: Skin is warm. Capillary refill takes less than 2 seconds.  Psychiatric: She has a normal mood and affect. Her behavior is normal. Judgment and thought content normal.    Assessment and Plan Blakelee was seen today for htn and medication check/refills.  Diagnoses and all orders for this visit:  Essential hypertension- bp elevated, explained that she should return in 3 months to show that she can achieve goal or a second agent will be added -     Comprehensive metabolic panel -     Microalbumin, urine -     hydrochlorothiazide (HYDRODIURIL) 25 MG tablet; Take 1 tablet (25 mg total) by mouth daily.  Dyslipidemia- will check levels  -     Lipid panel -     Comprehensive metabolic panel  Encounter for medication monitoring- pt with hypertension on nsaids, will check levels  -     Lipid panel -     Comprehensive metabolic panel -     Microalbumin, urine  Screening for diabetes mellitus -     Hemoglobin A1c  Overweight- discussed weight loss  Arthritis-  Continue meloxicam and continue walking  Other orders -     meloxicam (MOBIC) 7.5 MG tablet; Take 1 tablet (7.5 mg total) by mouth daily.     [place]

## 2017-08-22 NOTE — Patient Instructions (Addendum)
   IF you received an x-ray today, you will receive an invoice from [facility]. Please contact [facility] at [phone] with questions or concerns regarding your invoice.   IF you received labwork today, you will receive an invoice from [facility]. Please contact [facility] at [phone] with questions or concerns regarding your invoice.   Our billing staff will not be able to assist you with questions regarding bills from these companies.  You will be contacted with the lab results as soon as they are available. The fastest way to get your results is to activate your My Chart account. Instructions are located on the last page of this paperwork. If you have not heard from us regarding the results in 2 weeks, please contact this office.     Heart Disease Prevention Heart disease is a leading cause of death. There are many things you can do to help prevent heart disease. Be physically active Physical activity is good for your heart. It helps control your blood pressure, cholesterol levels, and weight. Try to be physically active every day. Ask your health care provider what activities are best for you. Be a healthy weight Extra weight can strain your heart and affect your blood pressure and cholesterol levels. Lose weight with diet and exercise if recommended by your health care provider. Eat heart-healthy foods Follow a healthy eating plan as recommended by your health care provider or dietitian. Heart-healthy foods include:  High-fiber foods. These include oat bran, oatmeal, and whole-grain breads and cereals.  Fruits and vegetables.  Avoid:  Alcohol.  Fried foods.  Foods high in saturated fat. These include meats, butter, whole dairy products, shortening, and coconut or palm oil.  Salty foods. These include canned food, luncheon meat, salty snacks, and fast food.  Keep your cholesterol levels under control Cholesterol is a substance that is used for many  important functions. When your cholesterol levels are high, cholesterol can stick to the insides of your blood vessels, making them narrow or clog. This can lead to chest pain (angina) and a heart attack. Keep your cholesterol levels under control as recommended by your health care provider. Have your cholesterol checked at least once a year. Target cholesterol levels (in mg/dL) for most people are:  Total cholesterol below 200.  LDL cholesterol below 100.  HDL cholesterol above 40 in men and above 50 in women.  Triglycerides below 150.  Keep your blood pressure under control Having high blood pressure (hypertension) puts you at risk for stroke and other forms of heart disease. Keep your blood pressure under control as recommended by your health care provider. Ask your health care provider if you need treatment to lower your blood pressure. If you are 18-39 years of age, have your blood pressure checked every 3-5 years. If you are 40 years of age or older, have your blood pressure checked every year. Do not use tobacco products Tobacco smoke can damage your heart and blood vessels. Do not use any tobacco products including cigarettes, chewing tobacco, or electronic cigarettes. If you need help quitting, ask your health care provider. Take medicines as directed Take medicines only as directed by your health care provider. Ask your health care provider whether you should take an aspirin every day. Taking aspirin can help reduce your risk of heart disease and stroke. Where to find more information: To find out more about heart disease, visit the American Heart Association's website at www.americanheart.org This information is not intended to replace advice given to   you by your health care provider. Make sure you discuss any questions you have with your health care provider. Document Released: 01/22/2004 Document Revised: 11/07/2015 Document Reviewed: 08/03/2013 Elsevier Interactive Patient  Education  2018 [employer] Inc[employer]  

## 2017-08-23 LAB — COMPREHENSIVE METABOLIC PANEL
ALBUMIN: 4.1 g/dL (ref 3.6–4.8)
ALT: 15 IU/L (ref 0–32)
AST: 23 IU/L (ref 0–40)
Albumin/Globulin Ratio: 1.5 (ref 1.2–2.2)
Alkaline Phosphatase: 74 IU/L (ref 39–117)
BUN / CREAT RATIO: 12 (ref 12–28)
BUN: 9 mg/dL (ref 8–27)
Bilirubin Total: 0.2 mg/dL (ref 0.0–1.2)
CALCIUM: 9.7 mg/dL (ref 8.7–10.3)
CO2: 27 mmol/L (ref 20–29)
CREATININE: 0.76 mg/dL (ref 0.57–1.00)
Chloride: 101 mmol/L (ref 96–106)
GFR calc Af Amer: 97 mL/min/{1.73_m2} (ref >59)
GFR, EST NON AFRICAN AMERICAN: 84 mL/min/{1.73_m2} (ref >59)
GLOBULIN, TOTAL: 2.8 g/dL (ref 1.5–4.5)
Glucose: 80 mg/dL (ref 65–99)
Potassium: 4.2 mmol/L (ref 3.5–5.2)
SODIUM: 142 mmol/L (ref 134–144)
Total Protein: 6.9 g/dL (ref 6.0–8.5)

## 2017-08-23 LAB — LIPID PANEL
CHOLESTEROL TOTAL: 219 mg/dL — AB (ref 100–199)
Chol/HDL Ratio: 3.7 ratio (ref 0.0–4.4)
HDL: 59 mg/dL (ref >39)
LDL Calculated: 144 mg/dL — ABNORMAL HIGH (ref 0–99)
Triglycerides: 82 mg/dL (ref 0–149)
VLDL Cholesterol Cal: 16 mg/dL (ref 5–40)

## 2017-08-23 LAB — HEMOGLOBIN A1C
Est. average glucose Bld gHb Est-mCnc: 123 mg/dL
HEMOGLOBIN A1C: 5.9 % — AB (ref 4.8–5.6)

## 2017-08-23 LAB — MICROALBUMIN, URINE

## 2017-08-27 ENCOUNTER — Telehealth: Payer: Self-pay | Admitting: Family Medicine

## 2017-08-27 NOTE — Telephone Encounter (Signed)
Copied from [place] [id number]. Topic: General - Other >> Aug 27, 2017  1:43 PM Yvette Rack wrote: Reason for CRM: patient calling for lab results  please call after 5

## 2017-08-28 NOTE — Telephone Encounter (Signed)
Spoke with pt advised her labs have not been released by the MD and so we can't give lab info until after they have been viewed and released by the MD.  Pt verbalized understanding and will await return call from [place] when released. Dgaddy, CMA

## 2017-09-01 NOTE — Telephone Encounter (Signed)
Copied from [place] [id number]. Topic: General - Other >> Sep 01, 2017 12:11 PM Vernona Rieger wrote: Patient said she missed a call from office for labs. Please call pt back @ [phone]

## 2017-09-04 MED ORDER — PRAVASTATIN SODIUM 20 MG PO TABS: 20.0000 mg | ORAL_TABLET | Freq: Every day | ORAL | 1 refills | Status: DC

## 2017-09-04 NOTE — Telephone Encounter (Signed)
Results released to the lab pool

## 2017-09-04 NOTE — Telephone Encounter (Signed)
Patient notified of lab results- and medication at pharmacy. Patient to follow up lab in 6 months.

## 2017-09-14 ENCOUNTER — Encounter: Payer: Self-pay | Admitting: *Deleted

## 2017-09-22 ENCOUNTER — Encounter: Payer: Self-pay | Admitting: Physician Assistant

## 2017-09-24 ENCOUNTER — Encounter: Payer: Self-pay | Admitting: Family Medicine

## 2017-09-24 DIAGNOSIS — Z1231 Encounter for screening mammogram for malignant neoplasm of breast: Secondary | ICD-10-CM | POA: Diagnosis not present

## 2017-09-24 DIAGNOSIS — Z01419 Encounter for gynecological examination (general) (routine) without abnormal findings: Secondary | ICD-10-CM | POA: Diagnosis not present

## 2017-09-25 ENCOUNTER — Telehealth: Payer: Self-pay | Admitting: Physician Assistant

## 2017-09-25 NOTE — Telephone Encounter (Signed)
Spoke with pt and she stated that her results were better. Advised to continue medication until follow up.

## 2017-09-25 NOTE — Telephone Encounter (Signed)
Copied from [place] [id number]. Topic: Quick Communication - See Telephone Encounter >> Sep 25, 2017  1:43 PM Bea Graff, NT wrote: CRM for notification. See Telephone encounter for: 09/25/17. Pt calling to speak with Daphane Shepherd regarding that her cholesterol was high at her appt and yesterday she had her yearly appt at [facility] and they rechecked her cholesterol. Pt would like a call with that result and what Daphane Shepherd thoughts on this are and if she believes pt could be pre-diabetic.

## 2017-11-13 ENCOUNTER — Other Ambulatory Visit: Payer: Self-pay | Admitting: Family Medicine

## 2017-11-13 ENCOUNTER — Ambulatory Visit: Payer: 59 | Admitting: Physician Assistant

## 2017-11-13 DIAGNOSIS — I10 Essential (primary) hypertension: Secondary | ICD-10-CM

## 2017-11-13 NOTE — Telephone Encounter (Signed)
HCTZ refill Last OV: 08/22/17 Last Refill:08/22/17 #90 tab Pharmacy: [facility] [place], [place] PCP: Dr Nolon Rod

## 2018-02-19 ENCOUNTER — Other Ambulatory Visit: Payer: Self-pay

## 2018-02-19 ENCOUNTER — Ambulatory Visit: Payer: 59 | Admitting: Physician Assistant

## 2018-02-19 ENCOUNTER — Encounter: Payer: Self-pay | Admitting: Physician Assistant

## 2018-02-19 VITALS — BP 173/90 | HR 72 | Temp 98.5°F | Resp 16 | Ht 66.0 in | Wt 166.2 lb

## 2018-02-19 DIAGNOSIS — M6289 Other specified disorders of muscle: Secondary | ICD-10-CM | POA: Diagnosis not present

## 2018-02-19 DIAGNOSIS — M25512 Pain in left shoulder: Secondary | ICD-10-CM

## 2018-02-19 MED ORDER — CYCLOBENZAPRINE HCL 5 MG PO TABS: 5.0000 mg | ORAL_TABLET | Freq: Three times a day (TID) | ORAL | 1 refills | Status: DC | PRN

## 2018-02-19 MED ORDER — MELOXICAM 7.5 MG PO TABS: 7.5000 mg | ORAL_TABLET | Freq: Every day | ORAL | 1 refills | Status: DC

## 2018-02-19 NOTE — Patient Instructions (Addendum)
Please read below for instructions for your back.  I can refer you to physical therapy if you are not improving.   Meloxiam is an NSAID. Do not use with any other otc pain medication other than tylenol/acetaminophen - so no aleve, ibuprofen, motrin, advil, etc.  Flexeril is a muscle relaxer. This may make you drowsy.   Apply moist heat to the area. Wet a towel and wring it out so it is damp. Put it in the microwave for about 15-20 seconds - long enough to make it hot, but not too hot to apply to your skin causing burns. Do this for about 20-30 minutes, 3-4 times a day.   Perform gentle, light neck stretches 2-3 times a day.   Put a tennis ball between your back and a wall. Gentle massage the area with rolling the ball around the affected area. Try a foam roller or trigger point back massager.   Stay well hydrated - try to drink 32-64 oz/day.   If you feel like you need a new pillow, try "My Pillow".  Come back and see me if you would like a dry needling session.   Trigger Point Dry Needling   What is Trigger Point Dry Needling (DN)?   1. DN is a physical therapy technique used to treat muscle pain and Dysfunction.  Specifically, DN helps deactivate muscle trigger points (Muscle Knots).   2. A thin filiform needle is used to penetrate the skin and stimulate the underlying trigger point.  The goal is for a local twitch response (LTR) to occur and for the trigger point to relax.  No medication of any kind is injected during the procedure.   What Does Trigger Point Dry Needling Feel Like?   1. The procedures feels different for each individual patient.   Some patients report that they do not actually feel the needle enter the skin and overall the process is not  painful.  Very  mild bleeding may occur.  However, many patients feel a deep cramping in the muscle in which the needle was inserted. This is the local twitch response.    How Will I Feel After The Treatment?   1. Soreness is  normal, and the onset of soreness may not occur for a few hours.  Typically this soreness does not last longer than two days.   2. Bruising is uncommon, however; ice can be used to decrease any possible bruising.   3. In rare cases feeling tired or nauseous after the treatment is normal.  In addition, your symptoms may get worse before they get better, this period will typically not last longer than 24 hours.   What Can I do After My Treatment?   1.  Increase your hydration by drinking more water for the next 24 hours.   2.  You may place ice or heat on the areas treated that have become sore, however don not use heat on inflamed or bruised areas.  Heat often brings more relief post needling.   3. You can continue your regular activities, but vigorous activity is not recommended initially after the treatment for 24 hours.   4. DN is best combined with other physical therapy such as strengthening, stretching, and other therapies.    IF you received an x-ray today, you will receive an invoice from [facility]. Please contact [facility] at [phone] with questions or concerns regarding your invoice.   IF you received labwork today, you will receive an invoice from [employer] [facility].  Please contact [facility] at [phone] with questions or concerns regarding your invoice.   Our billing staff will not be able to assist you with questions regarding bills from these companies.  You will be contacted with the lab results as soon as they are available. The fastest way to get your results is to activate your My Chart account. Instructions are located on the last page of this paperwork. If you have not heard from [place] regarding the results in 2 weeks, please contact this office.       IF you received an x-ray today, you will receive an invoice from [facility]. Please contact [facility] at [phone] with questions or concerns regarding your invoice.   IF you received  labwork today, you will receive an invoice from [facility]. Please contact [facility] at [phone] with questions or concerns regarding your invoice.   Our billing staff will not be able to assist you with questions regarding bills from these companies.  You will be contacted with the lab results as soon as they are available. The fastest way to get your results is to activate your My Chart account. Instructions are located on the last page of this paperwork. If you have not heard from [place] regarding the results in 2 weeks, please contact this office.

## 2018-02-19 NOTE — Progress Notes (Signed)
Audrey Wells  MRN: [id number] DOB: 1955/08/06  PCP: Patient, No Pcp Per  Subjective:  Pt is a 62 year old female who presents to clinic for left shoulder pain x 1 day.  Symptoms started last night. "I went to bed with it and it's been all day, nagging pain". moments are worse than others Endorses left sided neck pain as well. She is unable to move her neck completely side to side. Neck is not too stiff to move forward and back.  Denies dizziness, headache, double vision, chest pain, shortness of breath, heart racing, palpitations, nausea, vomiting, abdominal pain, hematuria, lower leg swelling.  Review of Systems  Respiratory: Negative for cough and shortness of breath.   Cardiovascular: Negative for chest pain, palpitations and leg swelling.  Musculoskeletal: Positive for arthralgias (L shoulder), back pain, neck pain and neck stiffness.  Skin: Negative.     Patient Active Problem List   Diagnosis Date Noted  . HTN (hypertension) 01/24/2017  . Elevated LDL cholesterol level 01/24/2017    Current Outpatient Medications on File Prior to Visit  Medication Sig Dispense Refill  . hydrochlorothiazide (HYDRODIURIL) 25 MG tablet TAKE 1 TABLET BY MOUTH ONCE DAILY 90 tablet 0  . meloxicam (MOBIC) 7.5 MG tablet Take 1 tablet (7.5 mg total) by mouth daily. 90 tablet 1  . pravastatin (PRAVACHOL) 20 MG tablet Take 1 tablet (20 mg total) by mouth daily. 90 tablet 1   No current facility-administered medications on file prior to visit.     No Known Allergies   Objective:  BP (!) 173/90 (BP Location: Right Arm, Patient Position: Sitting, Cuff Size: Normal)   Pulse 72   Temp 98.5 F (36.9 C) (Oral)   Resp 16   Ht 5\' 6"  (1.676 m)   Wt 166 lb 3.2 oz (75.4 kg)   SpO2 100%   BMI 26.83 kg/m   Physical Exam  Constitutional: She is oriented to person, place, and time. No distress.  Cardiovascular: Normal rate, regular rhythm and normal heart sounds.  Musculoskeletal:       Cervical  back: She exhibits decreased range of motion (side to side rotation of neck) and tenderness (MSK). She exhibits no bony tenderness.       Back:  Neurological: She is alert and oriented to person, place, and time. She has normal strength. No sensory deficit.  Skin: Skin is warm and dry.  Psychiatric: Judgment normal.  Vitals reviewed.   A trigger point/dry needling injection was performed at the site of maximal tenderness. This was well tolerated, and followed by mild relief of pain and mild increased ROM.  Assessment and Plan :  1. Muscle tightness 2. Left shoulder pain, unspecified chronicity - Pt presents with neck and left shoulder pain x 1 day. No red flags for cardiac. Mild relief after dry needling. Plan to treat supportively with heat, hydration, stretching and muscle relaxer. RTC in 5-7 days if no improvement. Consider PT referral. She agrees with plan. - meloxicam (MOBIC) 7.5 MG tablet; Take 1-2 tablets (7.5-15 mg total) by mouth daily.  Dispense: 30 tablet; Refill: 1 - cyclobenzaprine (FLEXERIL) 5 MG tablet; Take 1-2 tablets (5-10 mg total) by mouth 3 (three) times daily as needed for muscle spasms.  Dispense: 30 tablet; Refill: 1  Whitney Paulette Lynch, PA-C  [facility] [place] 02/19/2018 5:36 PM  Please note: Portions of this report may have been transcribed using dragon voice recognition software. Every effort was made to ensure accuracy;  however, inadvertent computerized transcription errors may be present.

## 2018-02-23 ENCOUNTER — Ambulatory Visit: Payer: 59 | Admitting: Physician Assistant

## 2018-02-23 ENCOUNTER — Ambulatory Visit (INDEPENDENT_AMBULATORY_CARE_PROVIDER_SITE_OTHER): Payer: 59

## 2018-02-23 ENCOUNTER — Ambulatory Visit: Payer: Self-pay

## 2018-02-23 ENCOUNTER — Other Ambulatory Visit: Payer: Self-pay

## 2018-02-23 ENCOUNTER — Encounter: Payer: Self-pay | Admitting: Physician Assistant

## 2018-02-23 VITALS — BP 130/80 | HR 82 | Temp 98.8°F | Resp 16 | Ht 66.5 in | Wt 171.6 lb

## 2018-02-23 DIAGNOSIS — M25512 Pain in left shoulder: Secondary | ICD-10-CM

## 2018-02-23 DIAGNOSIS — M4802 Spinal stenosis, cervical region: Secondary | ICD-10-CM | POA: Diagnosis not present

## 2018-02-23 DIAGNOSIS — M62838 Other muscle spasm: Secondary | ICD-10-CM

## 2018-02-23 DIAGNOSIS — M5412 Radiculopathy, cervical region: Secondary | ICD-10-CM | POA: Diagnosis not present

## 2018-02-23 MED ORDER — PREDNISONE 20 MG PO TABS: ORAL_TABLET | ORAL | 0 refills | Status: DC

## 2018-02-23 MED ORDER — TIZANIDINE HCL 2 MG PO CAPS: 2.0000 mg | ORAL_CAPSULE | Freq: Three times a day (TID) | ORAL | 0 refills | Status: DC

## 2018-02-23 MED ORDER — TRAMADOL HCL 50 MG PO TABS: 50.0000 mg | ORAL_TABLET | Freq: Three times a day (TID) | ORAL | 0 refills | Status: DC | PRN

## 2018-02-23 NOTE — Telephone Encounter (Signed)
Pt. Reports she saw Ms. McVey Friday for left shoulder pain and had dry needling procedure. States it helped for "about 30 minutes". States had " a lot of pain over the weekend." Reports Flexeril did not help "at all". Feels like left arm and hand are weaker."Hard to grasp things." Appointment made for today.  Reason for Disposition . [1] MODERATE pain (e.g., interferes with normal activities) AND [2] present > 3 days  Answer Assessment - Initial Assessment Questions 1. ONSET: "When did the pain start?"     Last Thursday night 2. LOCATION: "Where is the pain located?"     Left shoulder 3. PAIN: "How bad is the pain?" (Scale 1-10; or mild, moderate, severe)   - MILD (1-3): doesn't interfere with normal activities   - MODERATE (4-7): interferes with normal activities (e.g., work or school) or awakens from sleep   - SEVERE (8-10): excruciating pain, unable to do any normal activities, unable to move arm at all due to pain     7-8 4. WORK OR EXERCISE: "Has there been any recent work or exercise that involved this part of the body?"     No 5. CAUSE: "What do you think is causing the shoulder pain?"     Unsure 6. OTHER SYMPTOMS: "Do you have any other symptoms?" (e.g., neck pain, swelling, rash, fever, numbness, weakness)     Losing strength in my left arm. Hard to grip things  7. PREGNANCY: "Is there any chance you are pregnant?" "When was your last menstrual period?"     No  Protocols used: SHOULDER PAIN-A-AH

## 2018-02-23 NOTE — Progress Notes (Signed)
Audrey Wells  MRN: [id number] DOB: May 11, 1956  PCP: Patient, No Pcp Per  Subjective:  Pt is a 62 year old female who presents to clinic for left shoulder pain x 1 week. She was here for this problem 8/30. Flexeril is not helping.  She is applying ice and heat.  Endorses pain and tightness with turning her head side to side.  Endorses n/t and muscle weakness of her left hand this morning.   Review of Systems  Musculoskeletal: Positive for arthralgias, back pain, myalgias and neck stiffness. Negative for neck pain.  Skin: Negative.   Neurological: Positive for weakness and numbness.    Patient Active Problem List   Diagnosis Date Noted  . HTN (hypertension) 01/24/2017  . Elevated LDL cholesterol level 01/24/2017    Current Outpatient Medications on File Prior to Visit  Medication Sig Dispense Refill  . cyclobenzaprine (FLEXERIL) 5 MG tablet Take 1-2 tablets (5-10 mg total) by mouth 3 (three) times daily as needed for muscle spasms. 30 tablet 1  . hydrochlorothiazide (HYDRODIURIL) 25 MG tablet TAKE 1 TABLET BY MOUTH ONCE DAILY 90 tablet 0  . meloxicam (MOBIC) 7.5 MG tablet Take 1-2 tablets (7.5-15 mg total) by mouth daily. 30 tablet 1  . pravastatin (PRAVACHOL) 20 MG tablet Take 1 tablet (20 mg total) by mouth daily. 90 tablet 1   No current facility-administered medications on file prior to visit.     No Known Allergies   Objective:  BP 130/80 (BP Location: Right Arm, Patient Position: Sitting, Cuff Size: Normal)   Pulse 82   Temp 98.8 F (37.1 C) (Oral)   Resp 16   Ht 5' 6.5" (1.689 m)   Wt 171 lb 9.6 oz (77.8 kg)   SpO2 98%   BMI 27.28 kg/m   Physical Exam  Constitutional: She is oriented to person, place, and time. No distress.  Musculoskeletal:       Cervical back: She exhibits decreased range of motion (rotation laterally L&R). She exhibits no tenderness and no bony tenderness.  Left upper back tenderness with gripping my fingers and pulling in against  resistance.   Neurological: She is alert and oriented to person, place, and time. She has normal strength. No sensory deficit.  No reduced grip or arm strength. No focal weakness.   Skin: Skin is warm and dry.  Psychiatric: Judgment normal.  Vitals reviewed.  Dg Cervical Spine Complete  Result Date: 02/23/2018 CLINICAL DATA:  Pain radiating into left shoulder EXAM: CERVICAL SPINE - COMPLETE 4+ VIEW COMPARISON:  January 08, 2006 FINDINGS: Frontal, lateral, open-mouth odontoid, and bilateral oblique views were obtained. There is mild cervical levoscoliosis. There is mild reversal of lordotic curvature. No fracture or spondylolisthesis. Prevertebral soft tissues and predental space regions are normal. There is mild disc space narrowing at C5-6. Other disc spaces appear unremarkable. There is facet hypertrophy with exit foraminal narrowing on the left at C5-6. There is mild facet osteoarthritic change at C3-4 on the right. No erosive changes. Lung apices are clear. IMPRESSION: Areas of osteoarthritic change, most notably at C5-6. No fracture or spondylolisthesis. Reversal of lordotic curvature and levoscoliosis likely represent a degree of underlying muscle spasm. Electronically Signed   By: Lowella Grip III M.D.   On: 02/23/2018 11:54   Assessment and Plan :  1. Left shoulder pain, unspecified chronicity - pt presents with worsening left shoulder and neck pain.  New onset neuro symptoms. Cervical x-ray shows OA and reversal of lordotic curvature and levoscoliosis likely  represent a degree of underlying muscle spasm. No muscles weakness on PE. Plan to treat more aggressively for pain, add prednisone and refer to PT. RTC in 3 weeks to recheck symptoms. Consider MRI if no improvement or worsening symptoms.  - DG Cervical Spine Complete; Future - predniSONE (DELTASONE) 20 MG tablet; Take 3 PO QAM x3days, 2 PO QAM x3days, 1 PO QAM x3days  Dispense: 18 tablet; Refill: 0 - Ambulatory referral to Physical  Therapy - traMADol (ULTRAM) 50 MG tablet; Take 1 tablet (50 mg total) by mouth every 8 (eight) hours as needed for moderate pain.  Dispense: 30 tablet; Refill: 0  2. Muscle spasticity - Flexeril is not helping, will try different muscle relaxer.  - tizanidine (ZANAFLEX) 2 MG capsule; Take 1 capsule (2 mg total) by mouth 3 (three) times daily.  Dispense: 30 capsule; Refill: 0   Mercer Pod, PA-C  [facility] [place] 02/23/2018 11:32 AM  Please note: Portions of this report may have been transcribed using dragon voice recognition software. Every effort was made to ensure accuracy; however, inadvertent computerized transcription errors may be present.

## 2018-02-23 NOTE — Patient Instructions (Addendum)
Your x-ray shows arthritis of your neck.   You will receive a phone call to schedule an appointment with physical therapy.  Start taking prednisone: 3 pills x 3 days; 2 pills x 3 days; then 1 pill x 3 days.  Continue applying heat and stretching your neck.  Come back and see me in 3 weeks.   Ultram (tramadol) is a weak opioid medication tizanidine (ZANAFLEX) is a muscle relaxer Do not take these medications at the same time  You can take an NSAID (Mobic or ibuprofen) with tylenol. You can take Mobic and tylenol with tizanidine or ultram.    Thank you for coming in today. I hope you feel we met your needs.  Feel free to call PCP if you have any questions or further requests.  Please consider signing up for MyChart if you do not already have it, as this is a great way to communicate with me.  Best,  Whitney McVey, PA-C   IF you received an x-ray today, you will receive an invoice from [facility]. Please contact [facility] at [phone] with questions or concerns regarding your invoice.   IF you received labwork today, you will receive an invoice from [place]. Please contact [facility] at [phone] with questions or concerns regarding your invoice.   Our billing staff will not be able to assist you with questions regarding bills from these companies.  You will be contacted with the lab results as soon as they are available. The fastest way to get your results is to activate your My Chart account. Instructions are located on the last page of this paperwork. If you have not heard from [place] regarding the results in 2 weeks, please contact this office.

## 2018-03-02 ENCOUNTER — Telehealth: Payer: Self-pay | Admitting: Physician Assistant

## 2018-03-02 ENCOUNTER — Other Ambulatory Visit: Payer: Self-pay | Admitting: Physician Assistant

## 2018-03-02 DIAGNOSIS — M62838 Other muscle spasm: Secondary | ICD-10-CM

## 2018-03-02 NOTE — Telephone Encounter (Signed)
tizanidine refill Last Refill:02/23/18 # 30 Last OV: 02/23/18 PCP: [place]: [facility] [place], [place]

## 2018-03-02 NOTE — Telephone Encounter (Signed)
Copied from [place] [id number]. Topic: General - Other >> Mar 02, 2018  1:37 PM Synthia Innocent wrote: Reason for CRM: Needing copy of xrays of her neck to take to ortho dr. Would like burned to a CD. Please advise

## 2018-03-02 NOTE — Telephone Encounter (Signed)
Called pt and advised her that a CD has been made for her to pick up at the [address].  She verbalized understanding.

## 2018-03-06 DIAGNOSIS — M531 Cervicobrachial syndrome: Secondary | ICD-10-CM | POA: Diagnosis not present

## 2018-03-06 DIAGNOSIS — M5032 Other cervical disc degeneration, mid-cervical region, unspecified level: Secondary | ICD-10-CM | POA: Diagnosis not present

## 2018-03-06 DIAGNOSIS — M9901 Segmental and somatic dysfunction of cervical region: Secondary | ICD-10-CM | POA: Diagnosis not present

## 2018-03-08 DIAGNOSIS — M531 Cervicobrachial syndrome: Secondary | ICD-10-CM | POA: Diagnosis not present

## 2018-03-08 DIAGNOSIS — M9901 Segmental and somatic dysfunction of cervical region: Secondary | ICD-10-CM | POA: Diagnosis not present

## 2018-03-08 DIAGNOSIS — M5032 Other cervical disc degeneration, mid-cervical region, unspecified level: Secondary | ICD-10-CM | POA: Diagnosis not present

## 2018-03-09 ENCOUNTER — Other Ambulatory Visit: Payer: Self-pay | Admitting: Physician Assistant

## 2018-03-09 DIAGNOSIS — M62838 Other muscle spasm: Secondary | ICD-10-CM

## 2018-03-10 DIAGNOSIS — M9901 Segmental and somatic dysfunction of cervical region: Secondary | ICD-10-CM | POA: Diagnosis not present

## 2018-03-10 DIAGNOSIS — M5032 Other cervical disc degeneration, mid-cervical region, unspecified level: Secondary | ICD-10-CM | POA: Diagnosis not present

## 2018-03-10 DIAGNOSIS — M531 Cervicobrachial syndrome: Secondary | ICD-10-CM | POA: Diagnosis not present

## 2018-03-11 ENCOUNTER — Other Ambulatory Visit: Payer: Self-pay | Admitting: Family Medicine

## 2018-03-11 DIAGNOSIS — I10 Essential (primary) hypertension: Secondary | ICD-10-CM

## 2018-03-12 NOTE — Telephone Encounter (Signed)
Hydrodiuril 25mg  refill Last Refill:11/13/17 # 90 Last OV: 08/22/17 PCP: [place]/ [place] Pharmacy:[facility] [phone]

## 2018-03-13 DIAGNOSIS — M5032 Other cervical disc degeneration, mid-cervical region, unspecified level: Secondary | ICD-10-CM | POA: Diagnosis not present

## 2018-03-13 DIAGNOSIS — M9901 Segmental and somatic dysfunction of cervical region: Secondary | ICD-10-CM | POA: Diagnosis not present

## 2018-03-13 DIAGNOSIS — M531 Cervicobrachial syndrome: Secondary | ICD-10-CM | POA: Diagnosis not present

## 2018-03-16 ENCOUNTER — Telehealth: Payer: Self-pay | Admitting: Physician Assistant

## 2018-03-16 DIAGNOSIS — M25512 Pain in left shoulder: Secondary | ICD-10-CM

## 2018-03-16 NOTE — Telephone Encounter (Signed)
Patient returning call. Advised of message below. Patient states that she does not wish to make a follow up appointment at this time.

## 2018-03-16 NOTE — Telephone Encounter (Signed)
Attempted to contact pt regarding refill request; note from [place] dated 03/05/18 requests that pt follow up in 3 weeks; no upcoming appointments noted; left message for pt to call office on voicemail [phone]; when pt calls back please schedule follow up appointment.

## 2018-03-29 ENCOUNTER — Other Ambulatory Visit: Payer: Self-pay | Admitting: Physician Assistant

## 2018-03-29 DIAGNOSIS — M25512 Pain in left shoulder: Secondary | ICD-10-CM

## 2018-03-30 NOTE — Telephone Encounter (Signed)
Requested medication (s) are due for refill today: yes  Requested medication (s) are on the active medication list: yes  Last refill:  03/16/18  Future visit scheduled: no  Notes to clinic:  No hgb in past 360 days    Requested Prescriptions  Pending Prescriptions Disp Refills   meloxicam (MOBIC) 7.5 MG tablet [Pharmacy Med Name: MELOXICAM 7.5MG  TABLETS] 30 tablet 0    Sig: TAKE 1 TO 2 TABLETS(7.5 TO 15 MG) BY MOUTH DAILY     Analgesics:  COX2 Inhibitors Failed - 03/29/2018  3:17 AM      Failed - HGB in normal range and within 360 days    Hemoglobin  Date Value Ref Range Status  04/12/2013 11.9 (A) 12.2 - 16.2 g/dL Final         Passed - Cr in normal range and within 360 days    Creat  Date Value Ref Range Status  12/21/2015 0.67 0.50 - 0.99 mg/dL Final    Comment:      For patients > or = 63 years of age: The upper reference limit for Creatinine is approximately 13% higher for people identified as African-American.      Creatinine, Ser  Date Value Ref Range Status  08/22/2017 0.76 0.57 - 1.00 mg/dL Final         Passed - Patient is not pregnant      Passed - Valid encounter within last 12 months    Recent Outpatient Visits          1 month ago Left shoulder pain, unspecified chronicity   Primary Care at [facility], Gelene Mink, PA-C   1 month ago Muscle tightness   Primary Care at [facility], Gelene Mink, PA-C   7 months ago Essential hypertension   Primary Care at [facility], Arlie Solomons, MD   1 year ago Acute pain of left knee   Primary Care at [place], [place] D, PA-C   1 year ago Essential hypertension   [facility] at [facility], Damaris Hippo, PA-C

## 2018-04-08 ENCOUNTER — Other Ambulatory Visit: Payer: Self-pay | Admitting: Family Medicine

## 2018-04-08 NOTE — Telephone Encounter (Signed)
Requested Prescriptions  Pending Prescriptions Disp Refills  . pravastatin (PRAVACHOL) 20 MG tablet [Pharmacy Med Name: PRAVASTATIN 20MG  TABLETS] 90 tablet 0    Sig: TAKE 1 TABLET BY MOUTH ONCE DAILY     Cardiovascular:  Antilipid - Statins Failed - 04/08/2018  3:46 AM      Failed - Total Cholesterol in normal range and within 360 days    Cholesterol, Total  Date Value Ref Range Status  08/22/2017 219 (H) 100 - 199 mg/dL Final         Failed - LDL in normal range and within 360 days    LDL Calculated  Date Value Ref Range Status  08/22/2017 144 (H) 0 - 99 mg/dL Final         Passed - HDL in normal range and within 360 days    HDL  Date Value Ref Range Status  08/22/2017 59 >39 mg/dL Final         Passed - Triglycerides in normal range and within 360 days    Triglycerides  Date Value Ref Range Status  08/22/2017 82 0 - 149 mg/dL Final         Passed - Patient is not pregnant      Passed - Valid encounter within last 12 months    Recent Outpatient Visits          1 month ago Left shoulder pain, unspecified chronicity   Primary Care at [facility], [place], PA-C   1 month ago Muscle tightness   Primary Care at [facility], Gelene Mink, PA-C   7 months ago Essential hypertension   Primary Care at [facility], Arlie Solomons, MD   1 year ago Acute pain of left knee   Primary Care at [place], [place] D, PA-C   1 year ago Essential hypertension   [facility], Damaris Hippo, PA-C

## 2018-04-09 DIAGNOSIS — Z23 Encounter for immunization: Secondary | ICD-10-CM | POA: Diagnosis not present

## 2018-04-13 ENCOUNTER — Other Ambulatory Visit: Payer: Self-pay | Admitting: Physician Assistant

## 2018-04-13 DIAGNOSIS — M25512 Pain in left shoulder: Secondary | ICD-10-CM

## 2018-04-13 NOTE — Telephone Encounter (Signed)
Requested medication (s) are due for refill today: yes  Requested medication (s) are on the active medication list: yes  Last refill:  03/29/18  Future visit scheduled: no  Notes to clinic:  No hgb within 360 days   Requested Prescriptions  Pending Prescriptions Disp Refills   meloxicam (MOBIC) 7.5 MG tablet [Pharmacy Med Name: MELOXICAM 7.5MG  TABLETS] 30 tablet 0    Sig: TAKE 1 TO 2 TABLETS(7.5 TO 15 MG) BY MOUTH DAILY     Analgesics:  COX2 Inhibitors Failed - 04/13/2018  3:46 AM      Failed - HGB in normal range and within 360 days    Hemoglobin  Date Value Ref Range Status  04/12/2013 11.9 (A) 12.2 - 16.2 g/dL Final         Passed - Cr in normal range and within 360 days    Creat  Date Value Ref Range Status  12/21/2015 0.67 0.50 - 0.99 mg/dL Final    Comment:      For patients > or = 63 years of age: The upper reference limit for Creatinine is approximately 13% higher for people identified as African-American.      Creatinine, Ser  Date Value Ref Range Status  08/22/2017 0.76 0.57 - 1.00 mg/dL Final         Passed - Patient is not pregnant      Passed - Valid encounter within last 12 months    Recent Outpatient Visits          1 month ago Left shoulder pain, unspecified chronicity   Primary Care at [facility], Gelene Mink, PA-C   1 month ago Muscle tightness   Primary Care at [facility], Gelene Mink, PA-C   7 months ago Essential hypertension   [facility] at [facility], Arlie Solomons, MD   1 year ago Acute pain of left knee   Primary Care at [place], [place] D, PA-C   1 year ago Essential hypertension   [facility], Damaris Hippo, PA-C

## 2018-06-22 ENCOUNTER — Other Ambulatory Visit: Payer: Self-pay | Admitting: Family Medicine

## 2018-06-22 DIAGNOSIS — I10 Essential (primary) hypertension: Secondary | ICD-10-CM

## 2018-06-22 NOTE — Telephone Encounter (Signed)
Requested medication (s) are due for refill today: Yes  Requested medication (s) are on the active medication list: Yes  Last refill:  03/12/18  Future visit scheduled: No  Notes to clinic:  Unable to refill per protocol, appointment needed.     Requested Prescriptions  Pending Prescriptions Disp Refills   hydrochlorothiazide (HYDRODIURIL) 25 MG tablet [Pharmacy Med Name: HYDROCHLOROTHIAZIDE 25MG  TABLETS] 90 tablet 0    Sig: TAKE 1 TABLET BY MOUTH EVERY DAY     Cardiovascular: Diuretics - Thiazide Failed - 06/22/2018 10:54 AM      Failed - Valid encounter within last 6 months    Recent Outpatient Visits          3 months ago Left shoulder pain, unspecified chronicity   Primary Care at [place], PA-C   4 months ago Muscle tightness   Primary Care at [facility], Gelene Mink, PA-C   10 months ago Essential hypertension   Primary Care at [place], MD   1 year ago Acute pain of left knee   Primary Care at [place], [place] D, PA-C   1 year ago Essential hypertension   [facility], Damaris Hippo, PA-C             Passed - Ca in normal range and within 360 days    Calcium  Date Value Ref Range Status  08/22/2017 9.7 8.7 - 10.3 mg/dL Final         Passed - Cr in normal range and within 360 days    Creat  Date Value Ref Range Status  12/21/2015 0.67 0.50 - 0.99 mg/dL Final    Comment:      For patients > or = 63 years of age: The upper reference limit for Creatinine is approximately 13% higher for people identified as African-American.      Creatinine, Ser  Date Value Ref Range Status  08/22/2017 0.76 0.57 - 1.00 mg/dL Final         Passed - K in normal range and within 360 days    Potassium  Date Value Ref Range Status  08/22/2017 4.2 3.5 - 5.2 mmol/L Final         Passed - Na in normal range and within 360 days    Sodium  Date Value Ref Range Status  08/22/2017 142 134 - 144 mmol/L Final          Passed - Last BP in normal range    BP Readings from Last 1 Encounters:  02/23/18 130/80

## 2018-06-22 NOTE — Telephone Encounter (Signed)
Patient called, left VM to return call to the office to schedule an appointment with Dr. Brigitte Pulse in order to receive refills.

## 2018-07-26 ENCOUNTER — Other Ambulatory Visit: Payer: Self-pay | Admitting: Family Medicine

## 2018-07-26 NOTE — Telephone Encounter (Signed)
Requested Prescriptions  Pending Prescriptions Disp Refills  . pravastatin (PRAVACHOL) 20 MG tablet [Pharmacy Med Name: PRAVASTATIN 20MG  TABLETS] 90 tablet 0    Sig: TAKE 1 TABLET BY MOUTH ONCE DAILY     Cardiovascular:  Antilipid - Statins Failed - 07/26/2018 11:19 AM      Failed - Total Cholesterol in normal range and within 360 days    Cholesterol, Total  Date Value Ref Range Status  08/22/2017 219 (H) 100 - 199 mg/dL Final         Failed - LDL in normal range and within 360 days    LDL Calculated  Date Value Ref Range Status  08/22/2017 144 (H) 0 - 99 mg/dL Final         Passed - HDL in normal range and within 360 days    HDL  Date Value Ref Range Status  08/22/2017 59 >39 mg/dL Final         Passed - Triglycerides in normal range and within 360 days    Triglycerides  Date Value Ref Range Status  08/22/2017 82 0 - 149 mg/dL Final         Passed - Patient is not pregnant      Passed - Valid encounter within last 12 months    Recent Outpatient Visits          5 months ago Left shoulder pain, unspecified chronicity   [facility] at [facility], [place], PA-C   5 months ago Muscle tightness   [facility] at [facility], [place], PA-C   11 months ago Essential hypertension   [facility] at [facility], Arlie Solomons, MD   1 year ago Acute pain of left knee   [facility], [place], PA-C   1 year ago Essential hypertension   [facility], Damaris Hippo, PA-C      Future Appointments            In 2 weeks Shawnee Knapp, MD [facility] at [place]

## 2018-08-04 ENCOUNTER — Telehealth: Payer: Self-pay | Admitting: Family Medicine

## 2018-08-04 NOTE — Telephone Encounter (Signed)
LVM for pt regarding their appt on 2/19 with Dr. Brigitte Pulse. Due to provider being out on leave, appt has been cancelled. If pt calls in and would like to see another provider, please schedule at their convenience. If pt would like to wait until Dr. Brigitte Pulse returns to the office, please schedule accordingly at their convenience. Thank you!

## 2018-08-04 NOTE — Telephone Encounter (Signed)
Felicia called pt and LVM for pt to call back and Lavella Lemons will put her on the schedule for the 19th with Dr. Mitchel Honour. Please call [place] and ask for Lavella Lemons and she can get the appt scheduled.

## 2018-08-11 ENCOUNTER — Ambulatory Visit: Payer: 59 | Admitting: Emergency Medicine

## 2018-08-11 ENCOUNTER — Encounter: Payer: Self-pay | Admitting: Emergency Medicine

## 2018-08-11 ENCOUNTER — Other Ambulatory Visit: Payer: Self-pay

## 2018-08-11 ENCOUNTER — Ambulatory Visit: Payer: 59 | Admitting: Family Medicine

## 2018-08-11 VITALS — BP 144/79 | HR 64 | Temp 98.4°F | Resp 16 | Ht 65.75 in | Wt 170.0 lb

## 2018-08-11 DIAGNOSIS — I10 Essential (primary) hypertension: Secondary | ICD-10-CM

## 2018-08-11 DIAGNOSIS — E785 Hyperlipidemia, unspecified: Secondary | ICD-10-CM

## 2018-08-11 DIAGNOSIS — M25512 Pain in left shoulder: Secondary | ICD-10-CM

## 2018-08-11 MED ORDER — MELOXICAM 7.5 MG PO TABS: 7.5000 mg | ORAL_TABLET | Freq: Every day | ORAL | 1 refills | Status: DC | PRN

## 2018-08-11 MED ORDER — PRAVASTATIN SODIUM 20 MG PO TABS: 20.0000 mg | ORAL_TABLET | Freq: Every day | ORAL | 1 refills | Status: DC

## 2018-08-11 MED ORDER — HYDROCHLOROTHIAZIDE 25 MG PO TABS: 25.0000 mg | ORAL_TABLET | Freq: Every day | ORAL | 3 refills | Status: DC

## 2018-08-11 NOTE — Patient Instructions (Addendum)
   If you have lab work done today you will be contacted with your lab results within the next 2 weeks.  If you have not heard from us then please contact us. The fastest way to get your results is to register for My Chart.   IF you received an x-ray today, you will receive an invoice from [facility]. Please contact [facility] at [phone] with questions or concerns regarding your invoice.   IF you received labwork today, you will receive an invoice from [facility]. Please contact [facility] at [phone] with questions or concerns regarding your invoice.   Our billing staff will not be able to assist you with questions regarding bills from these companies.  You will be contacted with the lab results as soon as they are available. The fastest way to get your results is to activate your My Chart account. Instructions are located on the last page of this paperwork. If you have not heard from us regarding the results in 2 weeks, please contact this office.       Hypertension Hypertension, commonly called high blood pressure, is when the force of blood pumping through the arteries is too strong. The arteries are the blood vessels that carry blood from the heart throughout the body. Hypertension forces the heart to work harder to pump blood and may cause arteries to become narrow or stiff. Having untreated or uncontrolled hypertension can cause heart attacks, strokes, kidney disease, and other problems. A blood pressure reading consists of a higher number over a lower number. Ideally, your blood pressure should be below 120/80. The first ("top") number is called the systolic pressure. It is a measure of the pressure in your arteries as your heart beats. The second ("bottom") number is called the diastolic pressure. It is a measure of the pressure in your arteries as the heart relaxes. What are the causes? The cause of this condition is not known. What increases the  risk? Some risk factors for high blood pressure are under your control. Others are not. Factors you can change  Smoking.  Having type 2 diabetes mellitus, high cholesterol, or both.  Not getting enough exercise or physical activity.  Being overweight.  Having too much fat, sugar, calories, or salt (sodium) in your diet.  Drinking too much alcohol. Factors that are difficult or impossible to change  Having chronic kidney disease.  Having a family history of high blood pressure.  Age. Risk increases with age.  Race. You may be at higher risk if you are African-American.  Gender. Men are at higher risk than women before age 45. After age 65, women are at higher risk than men.  Having obstructive sleep apnea.  Stress. What are the signs or symptoms? Extremely high blood pressure (hypertensive crisis) may cause:  Headache.  Anxiety.  Shortness of breath.  Nosebleed.  Nausea and vomiting.  Severe chest pain.  Jerky movements you cannot control (seizures). How is this diagnosed? This condition is diagnosed by measuring your blood pressure while you are seated, with your arm resting on a surface. The cuff of the blood pressure monitor will be placed directly against the skin of your upper arm at the level of your heart. It should be measured at least twice using the same arm. Certain conditions can cause a difference in blood pressure between your right and left arms. Certain factors can cause blood pressure readings to be lower or higher than normal (elevated) for a short period of time:    When your blood pressure is higher when you are in a health care provider's office than when you are at home, this is called white coat hypertension. Most people with this condition do not need medicines.  When your blood pressure is higher at home than when you are in a health care provider's office, this is called masked hypertension. Most people with this condition may need medicines  to control blood pressure. If you have a high blood pressure reading during one visit or you have normal blood pressure with other risk factors:  You may be asked to return on a different day to have your blood pressure checked again.  You may be asked to monitor your blood pressure at home for 1 week or longer. If you are diagnosed with hypertension, you may have other blood or imaging tests to help your health care provider understand your overall risk for other conditions. How is this treated? This condition is treated by making healthy lifestyle changes, such as eating healthy foods, exercising more, and reducing your alcohol intake. Your health care provider may prescribe medicine if lifestyle changes are not enough to get your blood pressure under control, and if:  Your systolic blood pressure is above 130.  Your diastolic blood pressure is above 80. Your personal target blood pressure may vary depending on your medical conditions, your age, and other factors. Follow these instructions at home: Eating and drinking   Eat a diet that is high in fiber and potassium, and low in sodium, added sugar, and fat. An example eating plan is called the DASH (Dietary Approaches to Stop Hypertension) diet. To eat this way: ? Eat plenty of fresh fruits and vegetables. Try to fill half of your plate at each meal with fruits and vegetables. ? Eat whole grains, such as whole wheat pasta, brown rice, or whole grain bread. Fill about one quarter of your plate with whole grains. ? Eat or drink low-fat dairy products, such as skim milk or low-fat yogurt. ? Avoid fatty cuts of meat, processed or cured meats, and poultry with skin. Fill about one quarter of your plate with lean proteins, such as fish, chicken without skin, beans, eggs, and tofu. ? Avoid premade and processed foods. These tend to be higher in sodium, added sugar, and fat.  Reduce your daily sodium intake. Most people with hypertension should  eat less than 1,500 mg of sodium a day.  Limit alcohol intake to no more than 1 drink a day for nonpregnant women and 2 drinks a day for men. One drink equals 12 oz of beer, 5 oz of wine, or 1 oz of hard liquor. Lifestyle   Work with your health care provider to maintain a healthy body weight or to lose weight. Ask what an ideal weight is for you.  Get at least 30 minutes of exercise that causes your heart to beat faster (aerobic exercise) most days of the week. Activities may include walking, swimming, or biking.  Include exercise to strengthen your muscles (resistance exercise), such as pilates or lifting weights, as part of your weekly exercise routine. Try to do these types of exercises for 30 minutes at least 3 days a week.  Do not use any products that contain nicotine or tobacco, such as cigarettes and e-cigarettes. If you need help quitting, ask your health care provider.  Monitor your blood pressure at home as told by your health care provider.  Keep all follow-up visits as told by your health care provider.   This is important. Medicines  Take over-the-counter and prescription medicines only as told by your health care provider. Follow directions carefully. Blood pressure medicines must be taken as prescribed.  Do not skip doses of blood pressure medicine. Doing this puts you at risk for problems and can make the medicine less effective.  Ask your health care provider about side effects or reactions to medicines that you should watch for. Contact a health care provider if:  You think you are having a reaction to a medicine you are taking.  You have headaches that keep coming back (recurring).  You feel dizzy.  You have swelling in your ankles.  You have trouble with your vision. Get help right away if:  You develop a severe headache or confusion.  You have unusual weakness or numbness.  You feel faint.  You have severe pain in your chest or abdomen.  You vomit  repeatedly.  You have trouble breathing. Summary  Hypertension is when the force of blood pumping through your arteries is too strong. If this condition is not controlled, it may put you at risk for serious complications.  Your personal target blood pressure may vary depending on your medical conditions, your age, and other factors. For most people, a normal blood pressure is less than 120/80.  Hypertension is treated with lifestyle changes, medicines, or a combination of both. Lifestyle changes include weight loss, eating a healthy, low-sodium diet, exercising more, and limiting alcohol. This information is not intended to replace advice given to you by your health care provider. Make sure you discuss any questions you have with your health care provider. Document Released: 06/09/2005 Document Revised: 05/07/2016 Document Reviewed: 05/07/2016 [employer] Interactive Patient Education  2019 [employer]  

## 2018-08-11 NOTE — Progress Notes (Signed)
BP Readings from Last 3 Encounters:  08/11/18 (!) 144/79  02/23/18 130/80  02/19/18 (!) 173/90   Audrey Wells 63 y.o.   Chief Complaint  Patient presents with  . Medication Refill    HCTZ, PRAVASTATIN and  MOBIC,check cholesterol    HISTORY OF PRESENT ILLNESS: This is a 63 y.o. female with history of hypertension here for medication refills.  Has no complaints or medical concerns today.  HPI   Prior to Admission medications   Medication Sig Start Date End Date Taking? Authorizing Provider  hydrochlorothiazide (HYDRODIURIL) 25 MG tablet Take 1 tablet (25 mg total) by mouth daily. 08/11/18 11/09/18 Yes Jaquawn Saffran, Ines Bloomer, MD  meloxicam (MOBIC) 7.5 MG tablet Take 1 tablet (7.5 mg total) by mouth daily as needed for pain. 08/11/18  Yes Kiannah Grunow, Ines Bloomer, MD  pravastatin (PRAVACHOL) 20 MG tablet Take 1 tablet (20 mg total) by mouth daily. 08/11/18 11/09/18 Yes Christa Fasig, Ines Bloomer, MD  tizanidine (ZANAFLEX) 2 MG capsule Take 1 capsule (2 mg total) by mouth 3 (three) times daily. 02/23/18  Yes McVey, Gelene Mink, PA-C  cyclobenzaprine (FLEXERIL) 5 MG tablet Take 1-2 tablets (5-10 mg total) by mouth 3 (three) times daily as needed for muscle spasms. Patient not taking: Reported on 08/11/2018 02/19/18   McVey, Gelene Mink, PA-C  traMADol (ULTRAM) 50 MG tablet Take 1 tablet (50 mg total) by mouth every 8 (eight) hours as needed for moderate pain. Patient not taking: Reported on 08/11/2018 02/23/18   McVey, Gelene Mink, PA-C    No Known Allergies  Patient Active Problem List   Diagnosis Date Noted  . HTN (hypertension) 01/24/2017  . Elevated LDL cholesterol level 01/24/2017    Past Medical History:  Diagnosis Date  . Hypertension     Past Surgical History:  Procedure Laterality Date  . CESAREAN SECTION    . TUBAL LIGATION      Social History   Socioeconomic History  . Marital status: Married    Spouse name: [name]  . Number of children: 2  . Years  of education: 72  . Highest education level: Not on file  Occupational History  . Occupation: Forensic psychologist: [employer]  . Financial resource strain: Not on file  . Food insecurity:    Worry: Not on file    Inability: Not on file  . Transportation needs:    Medical: Not on file    Non-medical: Not on file  Tobacco Use  . Smoking status: Never Smoker  . Smokeless tobacco: Never Used  Substance and Sexual Activity  . Alcohol use: No    Alcohol/week: 0.0 standard drinks  . Drug use: No  . Sexual activity: Yes    Birth control/protection: Post-menopausal, Surgical  Lifestyle  . Physical activity:    Days per week: Not on file    Minutes per session: Not on file  . Stress: Not on file  Relationships  . Social connections:    Talks on phone: Not on file    Gets together: Not on file    Attends religious service: Not on file    Active member of club or organization: Not on file    Attends meetings of clubs or organizations: Not on file    Relationship status: Not on file  . Intimate partner violence:    Fear of current or ex partner: Not on file    Emotionally abused: Not on file    Physically abused: Not on file  Forced sexual activity: Not on file  Other Topics Concern  . Not on file  Social History Narrative   Lives with her husband and their 2 adult children.      Seat belt - 100%   Exercise - 3x/week for 1 hour    Family History  Problem Relation Age of Onset  . Arthritis Mother   . Hypertension Father   . Hypertension Brother      Review of Systems  Constitutional: Negative.  Negative for chills and fever.  HENT: Negative.   Eyes: Negative for blurred vision and double vision.  Respiratory: Negative.  Negative for cough and shortness of breath.   Cardiovascular: Negative.  Negative for chest pain, palpitations and leg swelling.  Gastrointestinal: Negative.  Negative for abdominal pain, diarrhea, heartburn and nausea.    Genitourinary: Negative.  Negative for dysuria and hematuria.  Skin: Negative.  Negative for rash.  Neurological: Negative.  Negative for dizziness and headaches.  Endo/Heme/Allergies: Negative.   All other systems reviewed and are negative.  Vitals:   08/11/18 1615  BP: (!) 144/79  Pulse: 64  Resp: 16  Temp: 98.4 F (36.9 C)  SpO2: 100%     Physical Exam Vitals signs reviewed.  Constitutional:      Appearance: Normal appearance.  HENT:     Head: Normocephalic and atraumatic.     Mouth/Throat:     Mouth: Mucous membranes are moist.     Pharynx: Oropharynx is clear.  Eyes:     Extraocular Movements: Extraocular movements intact.     Conjunctiva/sclera: Conjunctivae normal.     Pupils: Pupils are equal, round, and reactive to light.  Neck:     Musculoskeletal: Normal range of motion and neck supple.  Cardiovascular:     Rate and Rhythm: Normal rate and regular rhythm.     Heart sounds: Normal heart sounds.  Pulmonary:     Effort: Pulmonary effort is normal.     Breath sounds: Normal breath sounds.  Musculoskeletal: Normal range of motion.  Skin:    General: Skin is warm and dry.     Capillary Refill: Capillary refill takes less than 2 seconds.  Neurological:     General: No focal deficit present.     Mental Status: Audrey Wells is alert and oriented to person, place, and time.  Psychiatric:        Mood and Affect: Mood normal.        Behavior: Behavior normal.      ASSESSMENT & PLAN: Audrey Wells was seen today for medication refill.  Diagnoses and all orders for this visit:  Essential hypertension -     Comprehensive metabolic panel -     hydrochlorothiazide (HYDRODIURIL) 25 MG tablet; Take 1 tablet (25 mg total) by mouth daily.  Left shoulder pain, unspecified chronicity -     meloxicam (MOBIC) 7.5 MG tablet; Take 1 tablet (7.5 mg total) by mouth daily as needed for pain.  Dyslipidemia -     Lipid panel -     pravastatin (PRAVACHOL) 20 MG tablet; Take 1 tablet  (20 mg total) by mouth daily.    Patient Instructions       If you have lab work done today you will be contacted with your lab results within the next 2 weeks.  If you have not heard from [place] then please contact us. The fastest way to get your results is to register for My Chart.   IF you received an x-ray today, you will receive  an Pharmacologist from [facility]. Please contact [facility] at [phone] with questions or concerns regarding your invoice.   IF you received labwork today, you will receive an invoice from [facility]. Please contact [facility] at [phone] with questions or concerns regarding your invoice.   Our billing staff will not be able to assist you with questions regarding bills from these companies.  You will be contacted with the lab results as soon as they are available. The fastest way to get your results is to activate your My Chart account. Instructions are located on the last page of this paperwork. If you have not heard from [place] regarding the results in 2 weeks, please contact this office.     Hypertension Hypertension, commonly called high blood pressure, is when the force of blood pumping through the arteries is too strong. The arteries are the blood vessels that carry blood from the heart throughout the body. Hypertension forces the heart to work harder to pump blood and may cause arteries to become narrow or stiff. Having untreated or uncontrolled hypertension can cause heart attacks, strokes, kidney disease, and other problems. A blood pressure reading consists of a higher number over a lower number. Ideally, your blood pressure should be below 120/80. The first ("top") number is called the systolic pressure. It is a measure of the pressure in your arteries as your heart beats. The second ("bottom") number is called the diastolic pressure. It is a measure of the pressure in your arteries as the heart relaxes. What are the causes? The cause of  this condition is not known. What increases the risk? Some risk factors for high blood pressure are under your control. Others are not. Factors you can change  Smoking.  Having type 2 diabetes mellitus, high cholesterol, or both.  Not getting enough exercise or physical activity.  Being overweight.  Having too much fat, sugar, calories, or salt (sodium) in your diet.  Drinking too much alcohol. Factors that are difficult or impossible to change  Having chronic kidney disease.  Having a family history of high blood pressure.  Age. Risk increases with age.  Race. You may be at higher risk if you are African-American.  Gender. Men are at higher risk than women before age 79. After age 61, women are at higher risk than men.  Having obstructive sleep apnea.  Stress. What are the signs or symptoms? Extremely high blood pressure (hypertensive crisis) may cause:  Headache.  Anxiety.  Shortness of breath.  Nosebleed.  Nausea and vomiting.  Severe chest pain.  Jerky movements you cannot control (seizures). How is this diagnosed? This condition is diagnosed by measuring your blood pressure while you are seated, with your arm resting on a surface. The cuff of the blood pressure monitor will be placed directly against the skin of your upper arm at the level of your heart. It should be measured at least twice using the same arm. Certain conditions can cause a difference in blood pressure between your right and left arms. Certain factors can cause blood pressure readings to be lower or higher than normal (elevated) for a short period of time:  When your blood pressure is higher when you are in a health care provider's office than when you are at home, this is called white coat hypertension. Most people with this condition do not need medicines.  When your blood pressure is higher at home than when you are in a health care provider's office, this is called masked hypertension.  Most people with this condition may need medicines to control blood pressure. If you have a high blood pressure reading during one visit or you have normal blood pressure with other risk factors:  You may be asked to return on a different day to have your blood pressure checked again.  You may be asked to monitor your blood pressure at home for 1 week or longer. If you are diagnosed with hypertension, you may have other blood or imaging tests to help your health care provider understand your overall risk for other conditions. How is this treated? This condition is treated by making healthy lifestyle changes, such as eating healthy foods, exercising more, and reducing your alcohol intake. Your health care provider may prescribe medicine if lifestyle changes are not enough to get your blood pressure under control, and if:  Your systolic blood pressure is above 130.  Your diastolic blood pressure is above 80. Your personal target blood pressure may vary depending on your medical conditions, your age, and other factors. Follow these instructions at home: Eating and drinking   Eat a diet that is high in fiber and potassium, and low in sodium, added sugar, and fat. An example eating plan is called the DASH (Dietary Approaches to Stop Hypertension) diet. To eat this way: ? Eat plenty of fresh fruits and vegetables. Try to fill half of your plate at each meal with fruits and vegetables. ? Eat whole grains, such as whole wheat pasta, brown rice, or whole grain bread. Fill about one quarter of your plate with whole grains. ? Eat or drink low-fat dairy products, such as skim milk or low-fat yogurt. ? Avoid fatty cuts of meat, processed or cured meats, and poultry with skin. Fill about one quarter of your plate with lean proteins, such as fish, chicken without skin, beans, eggs, and tofu. ? Avoid premade and processed foods. These tend to be higher in sodium, added sugar, and fat.  Reduce your daily  sodium intake. Most people with hypertension should eat less than 1,500 mg of sodium a day.  Limit alcohol intake to no more than 1 drink a day for nonpregnant women and 2 drinks a day for men. One drink equals 12 oz of beer, 5 oz of wine, or 1 oz of hard liquor. Lifestyle   Work with your health care provider to maintain a healthy body weight or to lose weight. Ask what an ideal weight is for you.  Get at least 30 minutes of exercise that causes your heart to beat faster (aerobic exercise) most days of the week. Activities may include walking, swimming, or biking.  Include exercise to strengthen your muscles (resistance exercise), such as pilates or lifting weights, as part of your weekly exercise routine. Try to do these types of exercises for 30 minutes at least 3 days a week.  Do not use any products that contain nicotine or tobacco, such as cigarettes and e-cigarettes. If you need help quitting, ask your health care provider.  Monitor your blood pressure at home as told by your health care provider.  Keep all follow-up visits as told by your health care provider. This is important. Medicines  Take over-the-counter and prescription medicines only as told by your health care provider. Follow directions carefully. Blood pressure medicines must be taken as prescribed.  Do not skip doses of blood pressure medicine. Doing this puts you at risk for problems and can make the medicine less effective.  Ask your health care provider about side  effects or reactions to medicines that you should watch for. Contact a health care provider if:  You think you are having a reaction to a medicine you are taking.  You have headaches that keep coming back (recurring).  You feel dizzy.  You have swelling in your ankles.  You have trouble with your vision. Get help right away if:  You develop a severe headache or confusion.  You have unusual weakness or numbness.  You feel faint.  You have  severe pain in your chest or abdomen.  You vomit repeatedly.  You have trouble breathing. Summary  Hypertension is when the force of blood pumping through your arteries is too strong. If this condition is not controlled, it may put you at risk for serious complications.  Your personal target blood pressure may vary depending on your medical conditions, your age, and other factors. For most people, a normal blood pressure is less than 120/80.  Hypertension is treated with lifestyle changes, medicines, or a combination of both. Lifestyle changes include weight loss, eating a healthy, low-sodium diet, exercising more, and limiting alcohol. This information is not intended to replace advice given to you by your health care provider. Make sure you discuss any questions you have with your health care provider. Document Released: 06/09/2005 Document Revised: 05/07/2016 Document Reviewed: 05/07/2016 Elsevier Interactive Patient Education  2019 Elsevier Inc.       Agustina Caroli, MD [facility]

## 2018-08-12 LAB — COMPREHENSIVE METABOLIC PANEL
ALBUMIN: 4.3 g/dL (ref 3.8–4.8)
ALK PHOS: 78 IU/L (ref 39–117)
ALT: 12 IU/L (ref 0–32)
AST: 21 IU/L (ref 0–40)
Albumin/Globulin Ratio: 1.5 (ref 1.2–2.2)
BUN / CREAT RATIO: 13 (ref 12–28)
BUN: 9 mg/dL (ref 8–27)
Bilirubin Total: 0.3 mg/dL (ref 0.0–1.2)
CO2: 24 mmol/L (ref 20–29)
Calcium: 9.7 mg/dL (ref 8.7–10.3)
Chloride: 97 mmol/L (ref 96–106)
Creatinine, Ser: 0.68 mg/dL (ref 0.57–1.00)
GFR calc Af Amer: 108 mL/min/{1.73_m2} (ref >59)
GFR calc non Af Amer: 93 mL/min/{1.73_m2} (ref >59)
GLOBULIN, TOTAL: 2.9 g/dL (ref 1.5–4.5)
Glucose: 75 mg/dL (ref 65–99)
POTASSIUM: 3.9 mmol/L (ref 3.5–5.2)
SODIUM: 139 mmol/L (ref 134–144)
Total Protein: 7.2 g/dL (ref 6.0–8.5)

## 2018-08-12 LAB — LIPID PANEL
CHOLESTEROL TOTAL: 152 mg/dL (ref 100–199)
Chol/HDL Ratio: 2.8 ratio (ref 0.0–4.4)
HDL: 55 mg/dL (ref >39)
LDL Calculated: 82 mg/dL (ref 0–99)
TRIGLYCERIDES: 74 mg/dL (ref 0–149)
VLDL Cholesterol Cal: 15 mg/dL (ref 5–40)

## 2018-08-13 ENCOUNTER — Encounter: Payer: Self-pay | Admitting: Radiology

## 2018-08-30 ENCOUNTER — Ambulatory Visit: Payer: 59 | Admitting: Family Medicine

## 2018-08-30 ENCOUNTER — Encounter

## 2018-09-05 ENCOUNTER — Other Ambulatory Visit: Payer: Self-pay | Admitting: Family Medicine

## 2018-09-05 DIAGNOSIS — M25512 Pain in left shoulder: Secondary | ICD-10-CM

## 2018-09-06 NOTE — Telephone Encounter (Signed)
Requested medication (s) are due for refill today: yes  Requested medication (s) are on the active medication list: yes  Last refill:  08/11/18  Future visit scheduled: no  Notes to clinic:  COX2 inhibitors failed  Requested Prescriptions  Pending Prescriptions Disp Refills   meloxicam (MOBIC) 7.5 MG tablet [Pharmacy Med Name: MELOXICAM 7.5MG  TABLETS] 90 tablet     Sig: TAKE 1 TABLET BY MOUTH ONCE DAILY     Analgesics:  COX2 Inhibitors Failed - 09/05/2018 10:29 AM      Failed - HGB in normal range and within 360 days    Hemoglobin  Date Value Ref Range Status  04/12/2013 11.9 (A) 12.2 - 16.2 g/dL Final         Passed - Cr in normal range and within 360 days    Creat  Date Value Ref Range Status  12/21/2015 0.67 0.50 - 0.99 mg/dL Final    Comment:      For patients > or = 64 years of age: The upper reference limit for Creatinine is approximately 13% higher for people identified as African-American.      Creatinine, Ser  Date Value Ref Range Status  08/11/2018 0.68 0.57 - 1.00 mg/dL Final         Passed - Patient is not pregnant      Passed - Valid encounter within last 12 months    Recent Outpatient Visits          3 weeks ago Essential hypertension   [facility], Belle Rive, MD   6 months ago Left shoulder pain, unspecified chronicity   [facility], Gelene Mink, PA-C   6 months ago Muscle tightness   [facility] at [facility], Gelene Mink, PA-C   1 year ago Essential hypertension   [facility], Arlie Solomons, MD   1 year ago Acute pain of left knee   [facility] at [facility], [place] D, [place]

## 2018-09-14 ENCOUNTER — Other Ambulatory Visit: Payer: Self-pay | Admitting: Emergency Medicine

## 2018-09-14 DIAGNOSIS — M25512 Pain in left shoulder: Secondary | ICD-10-CM

## 2018-10-11 DIAGNOSIS — E7800 Pure hypercholesterolemia, unspecified: Secondary | ICD-10-CM | POA: Insufficient documentation

## 2018-10-11 DIAGNOSIS — Z6828 Body mass index (BMI) 28.0-28.9, adult: Secondary | ICD-10-CM | POA: Diagnosis not present

## 2018-10-11 DIAGNOSIS — Z01419 Encounter for gynecological examination (general) (routine) without abnormal findings: Secondary | ICD-10-CM | POA: Diagnosis not present

## 2018-10-12 ENCOUNTER — Other Ambulatory Visit: Payer: Self-pay | Admitting: Obstetrics and Gynecology

## 2018-10-12 DIAGNOSIS — R928 Other abnormal and inconclusive findings on diagnostic imaging of breast: Secondary | ICD-10-CM

## 2018-10-19 ENCOUNTER — Other Ambulatory Visit: Payer: Self-pay | Admitting: Obstetrics and Gynecology

## 2018-10-19 ENCOUNTER — Other Ambulatory Visit: Payer: 59

## 2018-10-19 ENCOUNTER — Other Ambulatory Visit: Payer: Self-pay

## 2018-10-19 ENCOUNTER — Ambulatory Visit: Payer: 59

## 2018-10-19 ENCOUNTER — Ambulatory Visit
Admission: RE | Admit: 2018-10-19 | Discharge: 2018-10-19 | Disposition: A | Discharge status: Home / Self Care | Payer: 59 | Source: Ambulatory Visit | Attending: Obstetrics and Gynecology | Admitting: Obstetrics and Gynecology

## 2018-10-19 DIAGNOSIS — R928 Other abnormal and inconclusive findings on diagnostic imaging of breast: Secondary | ICD-10-CM

## 2018-10-19 DIAGNOSIS — N6310 Unspecified lump in the right breast, unspecified quadrant: Secondary | ICD-10-CM

## 2018-10-28 ENCOUNTER — Ambulatory Visit
Admission: RE | Admit: 2018-10-28 | Discharge: 2018-10-28 | Disposition: A | Discharge status: Home / Self Care | Payer: 59 | Source: Ambulatory Visit | Attending: Obstetrics and Gynecology | Admitting: Obstetrics and Gynecology

## 2018-10-28 ENCOUNTER — Other Ambulatory Visit: Payer: Self-pay | Admitting: Diagnostic Radiology

## 2018-10-28 ENCOUNTER — Other Ambulatory Visit: Payer: Self-pay

## 2018-10-28 DIAGNOSIS — N6310 Unspecified lump in the right breast, unspecified quadrant: Secondary | ICD-10-CM

## 2018-10-28 DIAGNOSIS — D241 Benign neoplasm of right breast: Secondary | ICD-10-CM | POA: Diagnosis not present

## 2018-10-28 DIAGNOSIS — N6311 Unspecified lump in the right breast, upper outer quadrant: Secondary | ICD-10-CM | POA: Diagnosis not present

## 2018-10-28 DIAGNOSIS — N6011 Diffuse cystic mastopathy of right breast: Secondary | ICD-10-CM | POA: Diagnosis not present

## 2018-10-28 DIAGNOSIS — N6314 Unspecified lump in the right breast, lower inner quadrant: Secondary | ICD-10-CM | POA: Diagnosis not present

## 2018-10-29 ENCOUNTER — Other Ambulatory Visit: Payer: Self-pay | Admitting: Obstetrics and Gynecology

## 2018-10-29 DIAGNOSIS — N6310 Unspecified lump in the right breast, unspecified quadrant: Secondary | ICD-10-CM

## 2018-11-18 ENCOUNTER — Other Ambulatory Visit: Payer: Self-pay | Admitting: Emergency Medicine

## 2018-11-18 DIAGNOSIS — M25512 Pain in left shoulder: Secondary | ICD-10-CM

## 2018-11-23 ENCOUNTER — Ambulatory Visit (INDEPENDENT_AMBULATORY_CARE_PROVIDER_SITE_OTHER): Payer: 59 | Admitting: Family Medicine

## 2018-11-23 ENCOUNTER — Other Ambulatory Visit: Payer: Self-pay

## 2018-11-23 ENCOUNTER — Encounter: Payer: Self-pay | Admitting: Family Medicine

## 2018-11-23 VITALS — BP 130/74 | HR 67 | Temp 98.9°F | Resp 18 | Ht 65.75 in | Wt 170.0 lb

## 2018-11-23 DIAGNOSIS — M79602 Pain in left arm: Secondary | ICD-10-CM

## 2018-11-23 DIAGNOSIS — M1612 Unilateral primary osteoarthritis, left hip: Secondary | ICD-10-CM

## 2018-11-23 DIAGNOSIS — N6019 Diffuse cystic mastopathy of unspecified breast: Secondary | ICD-10-CM

## 2018-11-23 DIAGNOSIS — E785 Hyperlipidemia, unspecified: Secondary | ICD-10-CM | POA: Diagnosis not present

## 2018-11-23 DIAGNOSIS — M2550 Pain in unspecified joint: Secondary | ICD-10-CM | POA: Diagnosis not present

## 2018-11-23 DIAGNOSIS — M25512 Pain in left shoulder: Secondary | ICD-10-CM

## 2018-11-23 MED ORDER — MELOXICAM 7.5 MG PO TABS: ORAL_TABLET | ORAL | 1 refills | Status: DC

## 2018-11-23 NOTE — Progress Notes (Signed)
Established Patient Office Visit  Subjective:  Patient ID: Audrey Wells, female    DOB: July 28, 1955  Age: 63 y.o. MRN: [id number]  CC:  Chief Complaint  Patient presents with  . Arm Pain    left arm pain starts at shoulder moves to fingers that are numb x3weeks   . blood work    needs cholesterol check     HPI [employer] presents for   Left arm pain Patient reports 2 to 3 weeks of pain that radiates down from the neck down to the thumb on the left side.  She states that she gets numbness and tingling.  She has had previous episodes of shoulder pain on the left.  She states that she was diagnosed with osteoarthritis of the neck in the past.  She was treated previously with Flexeril and meloxicam.  She states that based on her changes to her joints and her finger that someone told her she should be evaluated for arthritis.  She denies any weakness at this time.  She has no family history of rheumatoid arthritis.  And she has never been worked up for rheumatoid arthritis.  Dyslipidemia and Fibrocystic breast changes Patient has not been taking her statin drug for the past month.  She states that she felt the pravastatin was causing her fibrocystic breast changes.  She states that she had a mammogram that was abnormal and due to the findings off fiber dense changes as well as needing an ultrasound she thought it might be related to her cholesterol medication. Lab Results  Component Value Date   CHOL 152 08/11/2018   CHOL 219 (H) 08/22/2017   CHOL 210 (H) 01/24/2017   Lab Results  Component Value Date   HDL 55 08/11/2018   HDL 59 08/22/2017   HDL 52 01/24/2017   Lab Results  Component Value Date   LDLCALC 82 08/11/2018   LDLCALC 144 (H) 08/22/2017   LDLCALC 133 (H) 01/24/2017   Lab Results  Component Value Date   TRIG 74 08/11/2018   TRIG 82 08/22/2017   TRIG 123 01/24/2017   Lab Results  Component Value Date   CHOLHDL 2.8 08/11/2018   CHOLHDL 3.7 08/22/2017    CHOLHDL 4.0 01/24/2017   No results found for: LDLDIRECT    Past Medical History:  Diagnosis Date  . Hypertension     Past Surgical History:  Procedure Laterality Date  . CESAREAN SECTION    . TUBAL LIGATION      Family History  Problem Relation Age of Onset  . Arthritis Mother   . Hypertension Father   . Hypertension Brother     Social History   Socioeconomic History  . Marital status: Married    Spouse name: [name]  . Number of children: 2  . Years of education: 55  . Highest education level: Not on file  Occupational History  . Occupation: Forensic psychologist: [employer]  . Financial resource strain: Not on file  . Food insecurity:    Worry: Not on file    Inability: Not on file  . Transportation needs:    Medical: Not on file    Non-medical: Not on file  Tobacco Use  . Smoking status: Never Smoker  . Smokeless tobacco: Never Used  Substance and Sexual Activity  . Alcohol use: No    Alcohol/week: 0.0 standard drinks  . Drug use: No  . Sexual activity: Yes    Birth control/protection:  Post-menopausal, Surgical  Lifestyle  . Physical activity:    Days per week: Not on file    Minutes per session: Not on file  . Stress: Not on file  Relationships  . Social connections:    Talks on phone: Not on file    Gets together: Not on file    Attends religious service: Not on file    Active member of club or organization: Not on file    Attends meetings of clubs or organizations: Not on file    Relationship status: Not on file  . Intimate partner violence:    Fear of current or ex partner: Not on file    Emotionally abused: Not on file    Physically abused: Not on file    Forced sexual activity: Not on file  Other Topics Concern  . Not on file  Social History Narrative   Lives with her husband and their 2 adult children.      Seat belt - 100%   Exercise - 3x/week for 1 hour    Outpatient Medications Prior to Visit  Medication Sig  Dispense Refill  . meloxicam (MOBIC) 7.5 MG tablet TAKE 1 TABLET(7.5 MG) BY MOUTH DAILY AS NEEDED FOR PAIN 30 tablet 0  . cyclobenzaprine (FLEXERIL) 5 MG tablet Take 1-2 tablets (5-10 mg total) by mouth 3 (three) times daily as needed for muscle spasms. (Patient not taking: Reported on 08/11/2018) 30 tablet 1  . hydrochlorothiazide (HYDRODIURIL) 25 MG tablet Take 1 tablet (25 mg total) by mouth daily. 90 tablet 3  . tizanidine (ZANAFLEX) 2 MG capsule Take 1 capsule (2 mg total) by mouth 3 (three) times daily. (Patient not taking: Reported on 11/23/2018) 30 capsule 0  . traMADol (ULTRAM) 50 MG tablet Take 1 tablet (50 mg total) by mouth every 8 (eight) hours as needed for moderate pain. (Patient not taking: Reported on 08/11/2018) 30 tablet 0  . pravastatin (PRAVACHOL) 20 MG tablet Take 1 tablet (20 mg total) by mouth daily. 90 tablet 1   No facility-administered medications prior to visit.     No Known Allergies  ROS Review of Systems  Constitutional: Negative for activity change and appetite change.  Respiratory: Negative for cough and shortness of breath.   Endocrine: Negative for cold intolerance and heat intolerance.  Neurological: Negative for dizziness and headaches.      Objective:     Vitals:   11/23/18 0906  BP: 130/74  Pulse: 67  Resp: 18  Temp: 98.9 F (37.2 C)  TempSrc: Oral  SpO2: 97%  Weight: 170 lb (77.1 kg)  Height: 5' 5.75" (1.67 m)   Wt Readings from Last 3 Encounters:  11/23/18 170 lb (77.1 kg)  08/11/18 170 lb (77.1 kg)  02/23/18 171 lb 9.6 oz (77.8 kg)    Physical Exam  Constitutional: She is oriented to person, place, and time. She appears well-developed and well-nourished.  HENT:  Head: Normocephalic and atraumatic.  Cardiovascular: Normal rate, regular rhythm and normal heart sounds.  No murmur heard. Pulmonary/Chest: Effort normal and breath sounds normal. No respiratory distress. She has no wheezes.  Musculoskeletal:     Right shoulder: She  exhibits crepitus. She exhibits normal range of motion, no tenderness, no bony tenderness, no swelling and no effusion.     Left shoulder: She exhibits crepitus. She exhibits normal range of motion, no tenderness, no bony tenderness, no swelling and no effusion.     Cervical back: She exhibits normal range of motion, no tenderness, no  bony tenderness, no swelling, no edema and no deformity.     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 She has normal strength. She exhibits normal muscle tone.  Psychiatric: She has a normal mood and affect. Her behavior is normal. Judgment and thought content normal.   CLINICAL DATA:  Pain radiating into left shoulder  EXAM: CERVICAL SPINE - COMPLETE 4+ VIEW  COMPARISON:  January 08, 2006  FINDINGS: Frontal, lateral, open-mouth odontoid, and bilateral oblique views were obtained. There is mild cervical levoscoliosis. There is mild reversal of lordotic curvature.  No fracture or spondylolisthesis. Prevertebral soft tissues and predental space regions are normal. There is mild disc space narrowing at C5-6. Other disc spaces appear unremarkable. There is facet hypertrophy with exit foraminal narrowing on the left at C5-6. There is mild facet osteoarthritic change at C3-4 on the right. No erosive changes. Lung apices are clear.  IMPRESSION: Areas of osteoarthritic change, most notably at C5-6. No fracture or spondylolisthesis.  Reversal of lordotic curvature and levoscoliosis likely represent a degree of underlying muscle spasm.   Electronically Signed   By: Lowella Grip III M.D.   On: 02/23/2018 11:54     There are no preventive care reminders to display for this patient.  There are no preventive care reminders to display for this patient.  No results found for: TSH Lab Results  Component Value Date   WBC 6.7 04/12/2013   HGB 11.9 (A)  04/12/2013   HCT 38.1 04/12/2013   MCV 91.1 04/12/2013   Lab Results  Component Value Date   NA 139 08/11/2018   K 3.9 08/11/2018   CO2 24 08/11/2018   GLUCOSE 75 08/11/2018   BUN 9 08/11/2018   CREATININE 0.68 08/11/2018   BILITOT 0.3 08/11/2018   ALKPHOS 78 08/11/2018   AST 21 08/11/2018   ALT 12 08/11/2018   PROT 7.2 08/11/2018   ALBUMIN 4.3 08/11/2018   CALCIUM 9.7 08/11/2018   Lab Results  Component Value Date   CHOL 152 08/11/2018   Lab Results  Component Value Date   HDL 55 08/11/2018   Lab Results  Component Value Date   LDLCALC 82 08/11/2018   Lab Results  Component Value Date   TRIG 74 08/11/2018   Lab Results  Component Value Date   CHOLHDL 2.8 08/11/2018   Lab Results  Component Value Date   HGBA1C 5.9 (H) 08/22/2017    ECG - sinus rhythm, no st elevation     Assessment & Plan:   Problem List Items Addressed This Visit    None    Visit Diagnoses    Left arm pain    -  Primary   Relevant Orders   EKG 12-Lead   Polyarthralgia       Relevant Orders   CBC   Sedimentation Rate   Rheumatoid factor   Ambulatory referral to Rheumatology   Primary osteoarthritis of left hip       Relevant Medications   meloxicam (MOBIC) 7.5 MG tablet   Other Relevant Orders   CBC   Sedimentation Rate   Rheumatoid factor   Ambulatory referral to Rheumatology   Dyslipidemia       Left shoulder pain, unspecified chronicity       Relevant Medications   meloxicam (MOBIC) 7.5 MG tablet     Left arm pain- not cardiac, normal ECG Arthritis - physical findings suggestive of RA,  will test, referral placed for Rheumatology Fibrocystic breast changes - reassured pt that is this benign and unrelated to statin drug Dyslipidemia- advised pt to remain on drug holiday and recheck lipid at next visit   Meds ordered this encounter  Medications  . meloxicam (MOBIC) 7.5 MG tablet    Sig: TAKE 1 TABLET(7.5 MG) BY MOUTH DAILY AS NEEDED FOR PAIN    Dispense:  30  tablet    Refill:  1    Follow-up: Return in about 3 months (around 02/23/2019) for cholesterol check.    Forrest Moron, MD

## 2018-11-23 NOTE — Patient Instructions (Addendum)
If you have lab work done today you will be contacted with your lab results within the next 2 weeks.  If you have not heard from [place] then please contact us. The fastest way to get your results is to register for My Chart.   IF you received an x-ray today, you will receive an invoice from [facility]. Please contact [facility] at [phone] with questions or concerns regarding your invoice.   IF you received labwork today, you will receive an invoice from [facility]. Please contact [facility] at [phone] with questions or concerns regarding your invoice.   Our billing staff will not be able to assist you with questions regarding bills from these companies.  You will be contacted with the lab results as soon as they are available. The fastest way to get your results is to activate your My Chart account. Instructions are located on the last page of this paperwork. If you have not heard from [place] regarding the results in 2 weeks, please contact this office.     Arthritis Arthritis is a term that is commonly used to refer to joint pain or joint disease. There are more than 100 types of arthritis. What are the causes? The most common cause of this condition is wear and tear of a joint. Other causes include:  Gout.  Inflammation of a joint.  An infection of a joint.  Sprains and other injuries near the joint.  A drug reaction or allergic reaction. In some cases, the cause may not be known. What are the signs or symptoms? The main symptom of this condition is pain in the joint with movement. Other symptoms include:  Redness, swelling, or stiffness at a joint.  Warmth coming from the joint.  Fever.  Overall feeling of illness. How is this diagnosed? This condition may be diagnosed with a physical exam and tests, including:  Blood tests.  Urine tests.  Imaging tests, such as MRI, X-rays, or a CT scan. Sometimes, fluid is removed from a joint for  testing. How is this treated? Treatment for this condition may involve:  Treatment of the cause, if it is known.  Rest.  Raising (elevating) the joint.  Applying cold or hot packs to the joint.  Medicines to improve symptoms and reduce inflammation.  Injections of a steroid such as cortisone into the joint to help reduce pain and inflammation. Depending on the cause of your arthritis, you may need to make lifestyle changes to reduce stress on your joint. These changes may include exercising more and losing weight. Follow these instructions at home: Medicines  Take over-the-counter and prescription medicines only as told by your health care provider.  Do not take aspirin to relieve pain if gout is suspected. Activity  Rest your joint if told by your health care provider. Rest is important when your disease is active and your joint feels painful, swollen, or stiff.  Avoid activities that make the pain worse. It is important to balance activity with rest.  Exercise your joint regularly with range-of-motion exercises as told by your health care provider. Try doing low-impact exercise, such as: ? Swimming. ? Water aerobics. ? Biking. ? Walking. Joint Care   If your joint is swollen, keep it elevated if told by your health care provider.  If your joint feels stiff in the morning, try taking a warm shower.  If directed, apply heat to the joint. If you have diabetes, do not apply heat without permission from your health  care provider. ? Put a towel between the joint and the hot pack or heating pad. ? Leave the heat on the area for 20-30 minutes.  If directed, apply ice to the joint: ? Put ice in a plastic bag. ? Place a towel between your skin and the bag. ? Leave the ice on for 20 minutes, 2-3 times per day.  Keep all follow-up visits as told by your health care provider. This is important. Contact a health care provider if:  The pain gets worse.  You have a fever. Get  help right away if:  You develop severe joint pain, swelling, or redness.  Many joints become painful and swollen.  You develop severe back pain.  You develop severe weakness in your leg.  You cannot control your bladder or bowels. This information is not intended to replace advice given to you by your health care provider. Make sure you discuss any questions you have with your health care provider. Document Released: 07/17/2004 Document Revised: 11/15/2015 Document Reviewed: 09/04/2014 Elsevier Interactive Patient Education  2019 [employer].

## 2018-11-24 LAB — CBC
Hematocrit: 39.2 % (ref 34.0–46.6)
Hemoglobin: 12.7 g/dL (ref 11.1–15.9)
MCH: 28.3 pg (ref 26.6–33.0)
MCHC: 32.4 g/dL (ref 31.5–35.7)
MCV: 88 fL (ref 79–97)
Platelets: 278 10*3/uL (ref 150–450)
RBC: 4.48 x10E6/uL (ref 3.77–5.28)
RDW: 13.3 % (ref 11.7–15.4)
WBC: 4.8 10*3/uL (ref 3.4–10.8)

## 2018-11-24 LAB — RHEUMATOID FACTOR: Rheumatoid fact SerPl-aCnc: <10 IU/mL (ref 0.0–13.9)

## 2018-11-24 LAB — SEDIMENTATION RATE: Sed Rate: 27 mm/hr (ref 0–40)

## 2018-12-24 ENCOUNTER — Other Ambulatory Visit: Payer: Self-pay | Admitting: Family Medicine

## 2018-12-24 DIAGNOSIS — M25512 Pain in left shoulder: Secondary | ICD-10-CM

## 2019-03-08 ENCOUNTER — Other Ambulatory Visit: Payer: Self-pay

## 2019-03-08 ENCOUNTER — Encounter: Payer: Self-pay | Admitting: Family Medicine

## 2019-03-08 ENCOUNTER — Ambulatory Visit: Payer: 59 | Admitting: Family Medicine

## 2019-03-08 VITALS — BP 140/78 | HR 82 | Temp 98.6°F | Resp 12 | Ht 65.5 in | Wt 168.6 lb

## 2019-03-08 DIAGNOSIS — I10 Essential (primary) hypertension: Secondary | ICD-10-CM

## 2019-03-08 DIAGNOSIS — Z23 Encounter for immunization: Secondary | ICD-10-CM

## 2019-03-08 DIAGNOSIS — E785 Hyperlipidemia, unspecified: Secondary | ICD-10-CM | POA: Diagnosis not present

## 2019-03-08 NOTE — Patient Instructions (Signed)
° ° ° °  If you have lab work done today you will be contacted with your lab results within the next 2 weeks.  If you have not heard from us then please contact us. The fastest way to get your results is to register for My Chart. ° ° °IF you received an x-ray today, you will receive an invoice from [facility]. Please contact [facility] at [phone] with questions or concerns regarding your invoice.  ° °IF you received labwork today, you will receive an invoice from [facility]. Please contact [facility] at [phone] with questions or concerns regarding your invoice.  ° °Our billing staff will not be able to assist you with questions regarding bills from these companies. ° °You will be contacted with the lab results as soon as they are available. The fastest way to get your results is to activate your My Chart account. Instructions are located on the last page of this paperwork. If you have not heard from us regarding the results in 2 weeks, please contact this office. °  ° ° ° °

## 2019-03-08 NOTE — Progress Notes (Signed)
Established Patient Office Visit  Subjective:  Patient ID: Audrey Wells, female    DOB: 11/29/1955  Age: 64 y.o. MRN: [id number]  CC:  Chief Complaint  Patient presents with  . Hyperlipidemia    3 mon f/u on hyperlipdemia with no other issues at this time    HPI [employer] presents for   Hypertension: Patient here for follow-up of elevated blood pressure. She is exercising and is adherent to low salt diet.  Blood pressure is well controlled at home. Cardiac symptoms none. Patient denies chest pain, dyspnea, exertional chest pressure/discomfort, fatigue, irregular heart beat and near-syncope.  Cardiovascular risk factors: hypertension. Use of agents associated with hypertension: none. Pt took a one month drug holiday.  BP Readings from Last 3 Encounters:  03/08/19 140/78  11/23/18 130/74  08/11/18 (!) 144/79   Dyslipidemia- diet controlled She has been using her lifestyle modification to control her cholesterol She is also exercising She denies any history of heart attacks, strokes and heart disease  Lab Results  Component Value Date   CHOL 152 08/11/2018   CHOL 219 (H) 08/22/2017   CHOL 210 (H) 01/24/2017   Lab Results  Component Value Date   HDL 55 08/11/2018   HDL 59 08/22/2017   HDL 52 01/24/2017   Lab Results  Component Value Date   LDLCALC 82 08/11/2018   LDLCALC 144 (H) 08/22/2017   LDLCALC 133 (H) 01/24/2017   Lab Results  Component Value Date   TRIG 74 08/11/2018   TRIG 82 08/22/2017   TRIG 123 01/24/2017   Lab Results  Component Value Date   CHOLHDL 2.8 08/11/2018   CHOLHDL 3.7 08/22/2017   CHOLHDL 4.0 01/24/2017   No results found for: LDLDIRECT  Past Medical History:  Diagnosis Date  . Hypertension     Past Surgical History:  Procedure Laterality Date  . CESAREAN SECTION    . TUBAL LIGATION      Family History  Problem Relation Age of Onset  . Arthritis Mother   . Hypertension Father   . Hypertension Brother      Social History   Socioeconomic History  . Marital status: Married    Spouse name: [name]  . Number of children: 2  . Years of education: 62  . Highest education level: Not on file  Occupational History  . Occupation: Forensic psychologist: [employer]  . Financial resource strain: Not on file  . Food insecurity    Worry: Not on file    Inability: Not on file  . Transportation needs    Medical: Not on file    Non-medical: Not on file  Tobacco Use  . Smoking status: Never Smoker  . Smokeless tobacco: Never Used  Substance and Sexual Activity  . Alcohol use: No    Alcohol/week: 0.0 standard drinks  . Drug use: No  . Sexual activity: Yes    Birth control/protection: Post-menopausal, Surgical  Lifestyle  . Physical activity    Days per week: Not on file    Minutes per session: Not on file  . Stress: Not on file  Relationships  . Social Herbalist on phone: Not on file    Gets together: Not on file    Attends religious service: Not on file    Active member of club or organization: Not on file    Attends meetings of clubs or organizations: Not on file    Relationship status: Not  on file  . Intimate partner violence    Fear of current or ex partner: Not on file    Emotionally abused: Not on file    Physically abused: Not on file    Forced sexual activity: Not on file  Other Topics Concern  . Not on file  Social History Narrative   Lives with her husband and their 2 adult children.      Seat belt - 100%   Exercise - 3x/week for 1 hour    Outpatient Medications Prior to Visit  Medication Sig Dispense Refill  . meloxicam (MOBIC) 7.5 MG tablet TAKE 1 TABLET(7.5 MG) BY MOUTH DAILY AS NEEDED FOR PAIN 30 tablet 1  . Multiple Vitamins-Minerals (CENTRUM SILVER 50+WOMEN PO) Take by mouth.    . hydrochlorothiazide (HYDRODIURIL) 25 MG tablet Take 1 tablet (25 mg total) by mouth daily. 90 tablet 3  . cyclobenzaprine (FLEXERIL) 5 MG tablet Take 1-2  tablets (5-10 mg total) by mouth 3 (three) times daily as needed for muscle spasms. 30 tablet 1  . tizanidine (ZANAFLEX) 2 MG capsule Take 1 capsule (2 mg total) by mouth 3 (three) times daily. 30 capsule 0  . traMADol (ULTRAM) 50 MG tablet Take 1 tablet (50 mg total) by mouth every 8 (eight) hours as needed for moderate pain. 30 tablet 0   No facility-administered medications prior to visit.     No Known Allergies  ROS Review of Systems Review of Systems  Constitutional: Negative for activity change, appetite change, chills and fever.  HENT: Negative for congestion, nosebleeds, trouble swallowing and voice change.   Respiratory: Negative for cough, shortness of breath and wheezing.   Gastrointestinal: Negative for diarrhea, nausea and vomiting.  Genitourinary: Negative for difficulty urinating, dysuria, flank pain and hematuria.  Musculoskeletal: Negative for back pain, joint swelling and neck pain.  Neurological: Negative for dizziness, speech difficulty, light-headedness and numbness.  See HPI. All other review of systems negative.     Objective:    Physical Exam  BP 140/78 (BP Location: Right Arm, Patient Position: Sitting, Cuff Size: Normal)   Pulse 82   Temp 98.6 F (37 C) (Oral)   Resp 12   Wt 168 lb 9.6 oz (76.5 kg)   SpO2 99%   BMI 27.42 kg/m  Wt Readings from Last 3 Encounters:  03/08/19 168 lb 9.6 oz (76.5 kg)  11/23/18 170 lb (77.1 kg)  08/11/18 170 lb (77.1 kg)    Physical Exam  Constitutional: Oriented to person, place, and time. Appears well-developed and well-nourished.  HENT:  Head: Normocephalic and atraumatic.  Eyes: Conjunctivae and EOM are normal.  Cardiovascular: Normal rate, regular rhythm, normal heart sounds and intact distal pulses.  No murmur heard. Pulmonary/Chest: Effort normal and breath sounds normal. No stridor. No respiratory distress. Has no wheezes.  Neurological: Is alert and oriented to person, place, and time.  Skin: Skin is  warm. Capillary refill takes less than 2 seconds.  Psychiatric: Has a normal mood and affect. Behavior is normal. Judgment and thought content normal.    Health Maintenance Due  Topic Date Due  . INFLUENZA VACCINE  01/22/2019    There are no preventive care reminders to display for this patient.  No results found for: TSH Lab Results  Component Value Date   WBC 4.8 11/23/2018   HGB 12.7 11/23/2018   HCT 39.2 11/23/2018   MCV 88 11/23/2018   PLT 278 11/23/2018   Lab Results  Component Value Date   NA 139  08/11/2018   K 3.9 08/11/2018   CO2 24 08/11/2018   GLUCOSE 75 08/11/2018   BUN 9 08/11/2018   CREATININE 0.68 08/11/2018   BILITOT 0.3 08/11/2018   ALKPHOS 78 08/11/2018   AST 21 08/11/2018   ALT 12 08/11/2018   PROT 7.2 08/11/2018   ALBUMIN 4.3 08/11/2018   CALCIUM 9.7 08/11/2018   Lab Results  Component Value Date   CHOL 152 08/11/2018   Lab Results  Component Value Date   HDL 55 08/11/2018   Lab Results  Component Value Date   LDLCALC 82 08/11/2018   Lab Results  Component Value Date   TRIG 74 08/11/2018   Lab Results  Component Value Date   CHOLHDL 2.8 08/11/2018   Lab Results  Component Value Date   HGBA1C 5.9 (H) 08/22/2017      Assessment & Plan:   Problem List Items Addressed This Visit      Cardiovascular and Mediastinum   HTN (hypertension) - Primary Patient's blood pressure is at goal of 139/89 or less. Condition is stable. Continue current medications and treatment plan. I recommend that you exercise for 30-45 minutes 5 days a week. I also recommend a balanced diet with fruits and vegetables every day, lean meats, and little fried foods. The DASH diet (you can find this online) is a good example of this.    Relevant Orders   Lipid Panel   Comprehensive metabolic panel    Other Visit Diagnoses    Need for prophylactic vaccination and inoculation against influenza       Relevant Orders   Flu Vaccine QUAD 6+ mos PF IM (Fluarix  Quad PF) (Completed)   Dyslipidemia    -  Diet controlled, will assess      No orders of the defined types were placed in this encounter.   Follow-up: No follow-ups on file.    Forrest Moron, MD

## 2019-03-09 LAB — COMPREHENSIVE METABOLIC PANEL
ALT: 10 IU/L (ref 0–32)
AST: 15 IU/L (ref 0–40)
Albumin/Globulin Ratio: 1.6 (ref 1.2–2.2)
Albumin: 4.7 g/dL (ref 3.8–4.8)
Alkaline Phosphatase: 82 IU/L (ref 39–117)
BUN/Creatinine Ratio: 10 — ABNORMAL LOW (ref 12–28)
BUN: 8 mg/dL (ref 8–27)
Bilirubin Total: 0.3 mg/dL (ref 0.0–1.2)
CO2: 24 mmol/L (ref 20–29)
Calcium: 9.8 mg/dL (ref 8.7–10.3)
Chloride: 105 mmol/L (ref 96–106)
Creatinine, Ser: 0.77 mg/dL (ref 0.57–1.00)
GFR calc Af Amer: 94 mL/min/{1.73_m2} (ref >59)
GFR calc non Af Amer: 82 mL/min/{1.73_m2} (ref >59)
Globulin, Total: 3 g/dL (ref 1.5–4.5)
Glucose: 84 mg/dL (ref 65–99)
Potassium: 3.7 mmol/L (ref 3.5–5.2)
Sodium: 142 mmol/L (ref 134–144)
Total Protein: 7.7 g/dL (ref 6.0–8.5)

## 2019-03-09 LAB — LIPID PANEL
Chol/HDL Ratio: 4.4 ratio (ref 0.0–4.4)
Cholesterol, Total: 249 mg/dL — ABNORMAL HIGH (ref 100–199)
HDL: 57 mg/dL (ref >39)
LDL Chol Calc (NIH): 175 mg/dL — ABNORMAL HIGH (ref 0–99)
Triglycerides: 99 mg/dL (ref 0–149)
VLDL Cholesterol Cal: 17 mg/dL (ref 5–40)

## 2019-03-12 ENCOUNTER — Other Ambulatory Visit: Payer: Self-pay | Admitting: Family Medicine

## 2019-03-12 DIAGNOSIS — M25512 Pain in left shoulder: Secondary | ICD-10-CM

## 2019-03-24 ENCOUNTER — Telehealth: Payer: Self-pay | Admitting: Family Medicine

## 2019-03-24 NOTE — Telephone Encounter (Signed)
Pt is wanting her lab results that were done on 03/08/2019.

## 2019-03-25 NOTE — Progress Notes (Signed)
Discussed lab results with patient.  Her LdL increased to 175 from 82.  She is going to start back on her Pravastatin.  We did discuss that if she is uncomfortable taking the medication everyday, to at least take every other day.  She verbalized understanding and is satisfied with plan.

## 2019-05-02 ENCOUNTER — Ambulatory Visit
Admission: RE | Admit: 2019-05-02 | Discharge: 2019-05-02 | Disposition: A | Discharge status: Home / Self Care | Payer: 59 | Source: Ambulatory Visit | Attending: Obstetrics and Gynecology | Admitting: Obstetrics and Gynecology

## 2019-05-02 ENCOUNTER — Ambulatory Visit: Payer: 59

## 2019-05-02 ENCOUNTER — Other Ambulatory Visit: Payer: Self-pay

## 2019-05-02 DIAGNOSIS — N6310 Unspecified lump in the right breast, unspecified quadrant: Secondary | ICD-10-CM

## 2019-05-21 ENCOUNTER — Other Ambulatory Visit: Payer: Self-pay | Admitting: Family Medicine

## 2019-05-21 DIAGNOSIS — M25512 Pain in left shoulder: Secondary | ICD-10-CM

## 2019-05-24 ENCOUNTER — Ambulatory Visit: Payer: 59 | Admitting: Family Medicine

## 2019-06-07 ENCOUNTER — Ambulatory Visit: Payer: 59 | Admitting: Family Medicine

## 2019-06-07 ENCOUNTER — Encounter: Payer: Self-pay | Admitting: Family Medicine

## 2019-06-07 ENCOUNTER — Ambulatory Visit (INDEPENDENT_AMBULATORY_CARE_PROVIDER_SITE_OTHER): Payer: 59 | Admitting: Family Medicine

## 2019-06-07 ENCOUNTER — Other Ambulatory Visit: Payer: Self-pay

## 2019-06-07 VITALS — BP 137/84

## 2019-06-07 VITALS — BP 140/79 | HR 82 | Temp 98.3°F | Wt 170.6 lb

## 2019-06-07 DIAGNOSIS — I10 Essential (primary) hypertension: Secondary | ICD-10-CM

## 2019-06-07 DIAGNOSIS — Z131 Encounter for screening for diabetes mellitus: Secondary | ICD-10-CM

## 2019-06-07 DIAGNOSIS — E785 Hyperlipidemia, unspecified: Secondary | ICD-10-CM | POA: Diagnosis not present

## 2019-06-07 MED ORDER — HYDROCHLOROTHIAZIDE 25 MG PO TABS: 25.0000 mg | ORAL_TABLET | Freq: Every day | ORAL | 3 refills | Status: DC

## 2019-06-07 NOTE — Progress Notes (Signed)
Established Patient Office Visit  Subjective:  Patient ID: Audrey Wells, female    DOB: May 16, 1956  Age: 63 y.o. MRN: [id number]  CC:  Chief Complaint  Patient presents with  . Hypertension    6 month f/u on bp. PAtient stated she been taking bp at home nd its been running in the 120/78    HPI [employer] presents for   Hypertension: Patient here for follow-up of elevated blood pressure. She is not exercising and is adherent to low salt diet.  Blood pressure is well controlled at home. Cardiac symptoms none. Patient denies chest pain, claudication, dyspnea, exertional chest pressure/discomfort, irregular heart beat and lower extremity edema.  Cardiovascular risk factors: dyslipidemia and hypertension. Use of agents associated with hypertension: none. History of target organ damage: none.   Dyslipidemia: Patient presents for evaluation of lipids.  Compliance with treatment thus far has been good.  A repeat fasting lipid profile was done.  The patient does not use medications that may worsen dyslipidemias (corticosteroids, progestins, anabolic steroids, diuretics, beta-blockers, amiodarone, cyclosporine, olanzapine). The patient exercises intermittently.  The patient is not known to have coexisting coronary artery disease.      Past Medical History:  Diagnosis Date  . Hypertension     Past Surgical History:  Procedure Laterality Date  . CESAREAN SECTION    . TUBAL LIGATION      Family History  Problem Relation Age of Onset  . Arthritis Mother   . Hypertension Father   . Hypertension Brother     Social History   Socioeconomic History  . Marital status: Married    Spouse name: [name]  . Number of children: 2  . Years of education: 62  . Highest education level: Not on file  Occupational History  . Occupation: CNA    Employer: [employer]  Tobacco Use  . Smoking status: Never Smoker  . Smokeless tobacco: Never Used  Substance and Sexual Activity    . Alcohol use: No    Alcohol/week: 0.0 standard drinks  . Drug use: No  . Sexual activity: Yes    Birth control/protection: Post-menopausal, Surgical  Other Topics Concern  . Not on file  Social History Narrative   Lives with her husband and their 2 adult children.      Seat belt - 100%   Exercise - 3x/week for 1 hour   Social Determinants of Health   Financial Resource Strain:   . Difficulty of Paying Living Expenses: Not on file  Food Insecurity:   . Worried About Charity fundraiser in the Last Year: Not on file  . Ran Out of Food in the Last Year: Not on file  Transportation Needs:   . Lack of Transportation (Medical): Not on file  . Lack of Transportation (Non-Medical): Not on file  Physical Activity:   . Days of Exercise per Week: Not on file  . Minutes of Exercise per Session: Not on file  Stress:   . Feeling of Stress : Not on file  Social Connections:   . Frequency of Communication with Friends and Family: Not on file  . Frequency of Social Gatherings with Friends and Family: Not on file  . Attends Religious Services: Not on file  . Active Member of Clubs or Organizations: Not on file  . Attends Archivist Meetings: Not on file  . Marital Status: Not on file  Intimate Partner Violence:   . Fear of Current or Ex-Partner: Not on file  .  Emotionally Abused: Not on file  . Physically Abused: Not on file  . Sexually Abused: Not on file    Outpatient Medications Prior to Visit  Medication Sig Dispense Refill  . meloxicam (MOBIC) 7.5 MG tablet TAKE 1 TABLET(7.5 MG) BY MOUTH DAILY AS NEEDED FOR PAIN 30 tablet 1  . Multiple Vitamins-Minerals (CENTRUM SILVER 50+WOMEN PO) Take by mouth.    . pravastatin (PRAVACHOL) 20 MG tablet Take 20 mg by mouth every other day.    . hydrochlorothiazide (HYDRODIURIL) 25 MG tablet Take 1 tablet (25 mg total) by mouth daily. 90 tablet 3   No facility-administered medications prior to visit.    No Known  Allergies  ROS Review of Systems Review of Systems  Constitutional: Negative for activity change, appetite change, chills and fever.  HENT: Negative for congestion, nosebleeds, trouble swallowing and voice change.   Respiratory: Negative for cough, shortness of breath and wheezing.   Gastrointestinal: Negative for diarrhea, nausea and vomiting.  Genitourinary: Negative for difficulty urinating, dysuria, flank pain and hematuria.  Musculoskeletal: Negative for back pain, joint swelling and neck pain.  Neurological: Negative for dizziness, speech difficulty, light-headedness and numbness.  See HPI. All other review of systems negative.     Objective:    Physical Exam  BP 140/79   Pulse 82   Temp 98.3 F (36.8 C) (Oral)   Wt 170 lb 9.6 oz (77.4 kg)   SpO2 99%   BMI 27.96 kg/m  Wt Readings from Last 3 Encounters:  06/07/19 170 lb 9.6 oz (77.4 kg)  03/08/19 168 lb 9.6 oz (76.5 kg)  11/23/18 170 lb (77.1 kg)   Physical Exam  Constitutional: Oriented to person, place, and time. Appears well-developed and well-nourished.  HENT:  Head: Normocephalic and atraumatic.  Eyes: Conjunctivae and EOM are normal.  Cardiovascular: Normal rate, regular rhythm, normal heart sounds and intact distal pulses.  No murmur heard. Pulmonary/Chest: Effort normal and breath sounds normal. No stridor. No respiratory distress. Has no wheezes.  Neurological: Is alert and oriented to person, place, and time.  Skin: Skin is warm. Capillary refill takes less than 2 seconds.  Psychiatric: Has a normal mood and affect. Behavior is normal. Judgment and thought content normal.    There are no preventive care reminders to display for this patient.  There are no preventive care reminders to display for this patient.  No results found for: TSH Lab Results  Component Value Date   WBC 4.8 11/23/2018   HGB 12.7 11/23/2018   HCT 39.2 11/23/2018   MCV 88 11/23/2018   PLT 278 11/23/2018   Lab Results   Component Value Date   NA 142 03/08/2019   K 3.7 03/08/2019   CO2 24 03/08/2019   GLUCOSE 84 03/08/2019   BUN 8 03/08/2019   CREATININE 0.77 03/08/2019   BILITOT 0.3 03/08/2019   ALKPHOS 82 03/08/2019   AST 15 03/08/2019   ALT 10 03/08/2019   PROT 7.7 03/08/2019   ALBUMIN 4.7 03/08/2019   CALCIUM 9.8 03/08/2019   Lab Results  Component Value Date   CHOL 249 (H) 03/08/2019   Lab Results  Component Value Date   HDL 57 03/08/2019   Lab Results  Component Value Date   LDLCALC 175 (H) 03/08/2019   Lab Results  Component Value Date   TRIG 99 03/08/2019   Lab Results  Component Value Date   CHOLHDL 4.4 03/08/2019   Lab Results  Component Value Date   HGBA1C 5.9 (H) 08/22/2017  Assessment & Plan:   Problem List Items Addressed This Visit      Cardiovascular and Mediastinum   HTN (hypertension) - Patient's blood pressure is at goal of 139/89 or less. Condition is stable. Continue current medications and treatment plan. I recommend that you exercise for 30-45 minutes 5 days a week. I also recommend a balanced diet with fruits and vegetables every day, lean meats, and little fried foods. The DASH diet (you can find this online) is a good example of this.    Relevant Medications   hydrochlorothiazide (HYDRODIURIL) 25 MG tablet   pravastatin (PRAVACHOL) 20 MG tablet    Other Visit Diagnoses       Dyslipidemia    -  Since she is taking pravachol every other day if her cholesterol is not better she will need to take it daily to maintain adequate control. Discussed that this is more genetic that diet related Reassured her that a little ice cream is not her issue   Relevant Medications   pravastatin (PRAVACHOL) 20 MG tablet   Other Relevant Orders   Lipid Panel   CMP14+EGFR      Meds ordered this encounter  Medications  . hydrochlorothiazide (HYDRODIURIL) 25 MG tablet    Sig: Take 1 tablet (25 mg total) by mouth daily.    Dispense:  90 tablet    Refill:  3     Follow-up: No follow-ups on file.   A total of 25 minutes were spent face-to-face with the patient during this encounter and over half of that time was spent on counseling and coordination of care.  Forrest Moron, MD

## 2019-06-07 NOTE — Patient Instructions (Signed)
° ° ° °  If you have lab work done today you will be contacted with your lab results within the next 2 weeks.  If you have not heard from us then please contact us. The fastest way to get your results is to register for My Chart. ° ° °IF you received an x-ray today, you will receive an invoice from [facility]. Please contact [facility] at [phone] with questions or concerns regarding your invoice.  ° °IF you received labwork today, you will receive an invoice from [facility]. Please contact [facility] at [phone] with questions or concerns regarding your invoice.  ° °Our billing staff will not be able to assist you with questions regarding bills from these companies. ° °You will be contacted with the lab results as soon as they are available. The fastest way to get your results is to activate your My Chart account. Instructions are located on the last page of this paperwork. If you have not heard from us regarding the results in 2 weeks, please contact this office. °  ° ° ° °

## 2019-06-08 LAB — LIPID PANEL
Chol/HDL Ratio: 3.3 ratio (ref 0.0–4.4)
Cholesterol, Total: 187 mg/dL (ref 100–199)
HDL: 57 mg/dL (ref >39)
LDL Chol Calc (NIH): 113 mg/dL — ABNORMAL HIGH (ref 0–99)
Triglycerides: 92 mg/dL (ref 0–149)
VLDL Cholesterol Cal: 17 mg/dL (ref 5–40)

## 2019-06-08 LAB — CMP14+EGFR
ALT: 11 IU/L (ref 0–32)
AST: 17 IU/L (ref 0–40)
Albumin/Globulin Ratio: 1.7 (ref 1.2–2.2)
Albumin: 4.5 g/dL (ref 3.8–4.8)
Alkaline Phosphatase: 84 IU/L (ref 39–117)
BUN/Creatinine Ratio: 12 (ref 12–28)
BUN: 9 mg/dL (ref 8–27)
Bilirubin Total: 0.3 mg/dL (ref 0.0–1.2)
CO2: 24 mmol/L (ref 20–29)
Calcium: 9.7 mg/dL (ref 8.7–10.3)
Chloride: 102 mmol/L (ref 96–106)
Creatinine, Ser: 0.77 mg/dL (ref 0.57–1.00)
GFR calc Af Amer: 94 mL/min/{1.73_m2} (ref >59)
GFR calc non Af Amer: 82 mL/min/{1.73_m2} (ref >59)
Globulin, Total: 2.7 g/dL (ref 1.5–4.5)
Glucose: 80 mg/dL (ref 65–99)
Potassium: 3.6 mmol/L (ref 3.5–5.2)
Sodium: 141 mmol/L (ref 134–144)
Total Protein: 7.2 g/dL (ref 6.0–8.5)

## 2019-06-22 ENCOUNTER — Encounter: Payer: Self-pay | Admitting: Radiology

## 2019-06-23 ENCOUNTER — Telehealth: Payer: Self-pay | Admitting: Family Medicine

## 2019-06-23 NOTE — Telephone Encounter (Signed)
Pt wants to be called with resent lab results [phone] FR

## 2019-06-30 NOTE — Telephone Encounter (Signed)
Please let her know that her results are normal. A letter was mailed in December.

## 2019-07-01 NOTE — Telephone Encounter (Signed)
Called pt and confirmed that she has received the message from the provider.

## 2019-07-12 ENCOUNTER — Other Ambulatory Visit: Payer: Self-pay | Admitting: Obstetrics and Gynecology

## 2019-07-12 DIAGNOSIS — Z1231 Encounter for screening mammogram for malignant neoplasm of breast: Secondary | ICD-10-CM

## 2019-07-14 ENCOUNTER — Other Ambulatory Visit: Payer: Self-pay | Admitting: Obstetrics and Gynecology

## 2019-07-14 DIAGNOSIS — N644 Mastodynia: Secondary | ICD-10-CM

## 2019-07-25 ENCOUNTER — Other Ambulatory Visit: Payer: 59

## 2019-08-01 ENCOUNTER — Ambulatory Visit: Payer: 59

## 2019-08-01 ENCOUNTER — Other Ambulatory Visit: Payer: Self-pay

## 2019-08-01 ENCOUNTER — Ambulatory Visit
Admission: RE | Admit: 2019-08-01 | Discharge: 2019-08-01 | Disposition: A | Discharge status: Home / Self Care | Payer: 59 | Source: Ambulatory Visit | Attending: Obstetrics and Gynecology | Admitting: Obstetrics and Gynecology

## 2019-08-01 DIAGNOSIS — N644 Mastodynia: Secondary | ICD-10-CM

## 2019-08-24 ENCOUNTER — Other Ambulatory Visit: Payer: Self-pay

## 2019-08-24 DIAGNOSIS — E785 Hyperlipidemia, unspecified: Secondary | ICD-10-CM

## 2019-08-24 MED ORDER — PRAVASTATIN SODIUM 20 MG PO TABS: 20.0000 mg | ORAL_TABLET | ORAL | 1 refills | Status: DC

## 2019-09-16 ENCOUNTER — Ambulatory Visit: Payer: 59 | Attending: Internal Medicine

## 2019-09-16 DIAGNOSIS — Z23 Encounter for immunization: Secondary | ICD-10-CM

## 2019-09-16 NOTE — Progress Notes (Signed)
   Covid-19 Vaccination Clinic  Name:  Audrey Wells    MRN: [id number] DOB: July 15, 1955  09/16/2019  Ms. Cocuzza was observed post Covid-19 immunization for 15 minutes without incident. She was provided with Vaccine Information Sheet and instruction to access the V-Safe system.   Ms. Hilgeman was instructed to call 911 with any severe reactions post vaccine: Marland Kitchen Difficulty breathing  . Swelling of face and throat  . A fast heartbeat  . A bad rash all over body  . Dizziness and weakness   Immunizations Administered    Name Date Dose VIS Date Route   Pfizer COVID-19 Vaccine 09/16/2019  1:47 PM 0.3 mL 06/03/2019 Intramuscular   Manufacturer: [place]   Lot: [id number]   [place]: [id number]

## 2019-09-23 ENCOUNTER — Other Ambulatory Visit: Payer: Self-pay | Admitting: Family Medicine

## 2019-09-23 DIAGNOSIS — M25512 Pain in left shoulder: Secondary | ICD-10-CM

## 2019-10-04 ENCOUNTER — Ambulatory Visit: Payer: 59 | Admitting: Family Medicine

## 2019-10-10 ENCOUNTER — Ambulatory Visit: Payer: 59 | Admitting: Family Medicine

## 2019-10-12 ENCOUNTER — Ambulatory Visit: Payer: 59 | Attending: Internal Medicine

## 2019-10-12 DIAGNOSIS — Z23 Encounter for immunization: Secondary | ICD-10-CM

## 2019-10-12 NOTE — Progress Notes (Signed)
   Covid-19 Vaccination Clinic  Name:  Audrey Wells    MRN: [id number] DOB: 08-02-1955  10/12/2019  Audrey Wells was observed post Covid-19 immunization for 15 minutes without incident. She was provided with Vaccine Information Sheet and instruction to access the V-Safe system.   Audrey Wells was instructed to call 911 with any severe reactions post vaccine: Marland Kitchen Difficulty breathing  . Swelling of face and throat  . A fast heartbeat  . A bad rash all over body  . Dizziness and weakness   Immunizations Administered    Name Date Dose VIS Date Route   Pfizer COVID-19 Vaccine 10/12/2019  1:35 PM 0.3 mL 08/17/2018 Intramuscular   Manufacturer: [place]   Lot: [id number]   [place]: [id number]

## 2019-10-18 ENCOUNTER — Ambulatory Visit: Payer: 59 | Admitting: Family Medicine

## 2019-11-23 DIAGNOSIS — D249 Benign neoplasm of unspecified breast: Secondary | ICD-10-CM | POA: Insufficient documentation

## 2019-12-07 DIAGNOSIS — R3129 Other microscopic hematuria: Secondary | ICD-10-CM | POA: Insufficient documentation

## 2019-12-07 DIAGNOSIS — M48061 Spinal stenosis, lumbar region without neurogenic claudication: Secondary | ICD-10-CM | POA: Insufficient documentation

## 2020-03-01 ENCOUNTER — Other Ambulatory Visit: Payer: Self-pay | Admitting: Emergency Medicine

## 2020-03-01 DIAGNOSIS — I10 Essential (primary) hypertension: Secondary | ICD-10-CM

## 2020-03-01 NOTE — Telephone Encounter (Signed)
Courtesy refill. Called patient for appt no answer, left message to call back .

## 2020-03-07 ENCOUNTER — Other Ambulatory Visit: Payer: Self-pay | Admitting: Emergency Medicine

## 2020-03-07 DIAGNOSIS — I10 Essential (primary) hypertension: Secondary | ICD-10-CM

## 2020-05-22 ENCOUNTER — Ambulatory Visit: Payer: 59

## 2020-07-06 ENCOUNTER — Other Ambulatory Visit: Payer: Self-pay | Admitting: Emergency Medicine

## 2020-07-06 DIAGNOSIS — I10 Essential (primary) hypertension: Secondary | ICD-10-CM

## 2020-07-06 NOTE — Telephone Encounter (Signed)
Requested medication (s) are due for refill today: Yes  Requested medication (s) are on the active medication list: Yes  Last refill:  03/01/20  Future visit scheduled: No  Notes to clinic:  Unable to refill per protocol, appointment needed, courtesy refill given     Requested Prescriptions  Pending Prescriptions Disp Refills   hydrochlorothiazide (HYDRODIURIL) 25 MG tablet [Pharmacy Med Name: HYDROCHLOROTHIAZIDE 25MG  TABLETS] 30 tablet 0    Sig: TAKE 1 TABLET(25 MG) BY MOUTH DAILY      Cardiovascular: Diuretics - Thiazide Failed - 07/06/2020  4:48 PM      Failed - Ca in normal range and within 360 days    Calcium  Date Value Ref Range Status  06/07/2019 9.7 8.7 - 10.3 mg/dL Final          Failed - Cr in normal range and within 360 days    Creat  Date Value Ref Range Status  12/21/2015 0.67 0.50 - 0.99 mg/dL Final    Comment:      For patients > or = 66 years of age: The upper reference limit for Creatinine is approximately 13% higher for people identified as African-American.      Creatinine, Ser  Date Value Ref Range Status  06/07/2019 0.77 0.57 - 1.00 mg/dL Final          Failed - K in normal range and within 360 days    Potassium  Date Value Ref Range Status  06/07/2019 3.6 3.5 - 5.2 mmol/L Final          Failed - Na in normal range and within 360 days    Sodium  Date Value Ref Range Status  06/07/2019 141 134 - 144 mmol/L Final          Failed - Valid encounter within last 6 months    Recent Outpatient Visits           1 year ago Essential hypertension   Primary Care at [facility], Arlie Solomons, MD   1 year ago Screening for diabetes mellitus   Primary Care at [facility], Arlie Solomons, MD   1 year ago Essential hypertension   Primary Care at [facility], [place], MD   1 year ago Left arm pain   Primary Care at [facility], Arlie Solomons, MD   1 year ago Essential hypertension   Primary Care at [facility], [place], MD                 Passed - Last BP in normal range    BP Readings from Last 1 Encounters:  06/07/19 137/84

## 2020-07-17 ENCOUNTER — Other Ambulatory Visit: Payer: Self-pay

## 2020-07-17 ENCOUNTER — Encounter: Payer: Self-pay | Admitting: Family Medicine

## 2020-07-17 ENCOUNTER — Ambulatory Visit (INDEPENDENT_AMBULATORY_CARE_PROVIDER_SITE_OTHER): Payer: Medicare Other | Admitting: Family Medicine

## 2020-07-17 VITALS — BP 156/84 | HR 78 | Temp 98.0°F | Ht 65.5 in | Wt 173.0 lb

## 2020-07-17 DIAGNOSIS — E785 Hyperlipidemia, unspecified: Secondary | ICD-10-CM

## 2020-07-17 DIAGNOSIS — Z23 Encounter for immunization: Secondary | ICD-10-CM

## 2020-07-17 DIAGNOSIS — M25512 Pain in left shoulder: Secondary | ICD-10-CM | POA: Diagnosis not present

## 2020-07-17 DIAGNOSIS — I10 Essential (primary) hypertension: Secondary | ICD-10-CM | POA: Diagnosis not present

## 2020-07-17 DIAGNOSIS — Z1321 Encounter for screening for nutritional disorder: Secondary | ICD-10-CM

## 2020-07-17 DIAGNOSIS — L309 Dermatitis, unspecified: Secondary | ICD-10-CM | POA: Diagnosis not present

## 2020-07-17 DIAGNOSIS — Z1159 Encounter for screening for other viral diseases: Secondary | ICD-10-CM

## 2020-07-17 DIAGNOSIS — Z131 Encounter for screening for diabetes mellitus: Secondary | ICD-10-CM

## 2020-07-17 DIAGNOSIS — Z1329 Encounter for screening for other suspected endocrine disorder: Secondary | ICD-10-CM

## 2020-07-17 DIAGNOSIS — Z114 Encounter for screening for human immunodeficiency virus [HIV]: Secondary | ICD-10-CM

## 2020-07-17 DIAGNOSIS — Z130 Encounter for screening for diseases of the blood and blood-forming organs and certain disorders involving the immune mechanism: Secondary | ICD-10-CM

## 2020-07-17 MED ORDER — MELOXICAM 7.5 MG PO TABS: 7.5000 mg | ORAL_TABLET | Freq: Every day | ORAL | 1 refills | Status: DC

## 2020-07-17 MED ORDER — TRIAMCINOLONE ACETONIDE 0.1 % EX CREA: 1.0000 "application " | TOPICAL_CREAM | Freq: Two times a day (BID) | CUTANEOUS | 0 refills | Status: DC

## 2020-07-17 MED ORDER — HYDROCHLOROTHIAZIDE 25 MG PO TABS
ORAL_TABLET | ORAL | 1 refills | Status: DC
Start: 2020-07-17 — End: 2021-04-01

## 2020-07-17 NOTE — Patient Instructions (Addendum)
  Health Maintenance After Age 65 After age 65, you are at a higher risk for certain long-term diseases and infections as well as injuries from falls. Falls are a major cause of broken bones and head injuries in people who are older than age 65. Getting regular preventive care can help to keep you healthy and well. Preventive care includes getting regular testing and making lifestyle changes as recommended by your health care provider. Talk with your health care provider about:  Which screenings and tests you should have. A screening is a test that checks for a disease when you have no symptoms.  A diet and exercise plan that is right for you. What should I know about screenings and tests to prevent falls? Screening and testing are the best ways to find a health problem early. Early diagnosis and treatment give you the best chance of managing medical conditions that are common after age 65. Certain conditions and lifestyle choices may make you more likely to have a fall. Your health care provider may recommend:  Regular vision checks. Poor vision and conditions such as cataracts can make you more likely to have a fall. If you wear glasses, make sure to get your prescription updated if your vision changes.  Medicine review. Work with your health care provider to regularly review all of the medicines you are taking, including over-the-counter medicines. Ask your health care provider about any side effects that may make you more likely to have a fall. Tell your health care provider if any medicines that you take make you feel dizzy or sleepy.  Osteoporosis screening. Osteoporosis is a condition that causes the bones to get weaker. This can make the bones weak and cause them to break more easily.  Blood pressure screening. Blood pressure changes and medicines to control blood pressure can make you feel dizzy.  Strength and balance checks. Your health care provider may recommend certain tests to  check your strength and balance while standing, walking, or changing positions.  Foot health exam. Foot pain and numbness, as well as not wearing proper footwear, can make you more likely to have a fall.  Depression screening. You may be more likely to have a fall if you have a fear of falling, feel emotionally low, or feel unable to do activities that you used to do.  Alcohol use screening. Using too much alcohol can affect your balance and may make you more likely to have a fall. What actions can I take to lower my risk of falls? General instructions  Talk with your health care provider about your risks for falling. Tell your health care provider if: ? You fall. Be sure to tell your health care provider about all falls, even ones that seem minor. ? You feel dizzy, sleepy, or off-balance.  Take over-the-counter and prescription medicines only as told by your health care provider. These include any supplements.  Eat a healthy diet and maintain a healthy weight. A healthy diet includes low-fat dairy products, low-fat (lean) meats, and fiber from whole grains, beans, and lots of fruits and vegetables. Home safety  Remove any tripping hazards, such as rugs, cords, and clutter.  Install safety equipment such as grab bars in bathrooms and safety rails on stairs.  Keep rooms and walkways well-lit. Activity  Follow a regular exercise program to stay fit. This will help you maintain your balance. Ask your health care provider what types of exercise are appropriate for you.  If you need a cane   or walker, use it as recommended by your health care provider.  Wear supportive shoes that have nonskid soles.   Lifestyle  Do not drink alcohol if your health care provider tells you not to drink.  If you drink alcohol, limit how much you have: ? 0-1 drink a day for women. ? 0-2 drinks a day for men.  Be aware of how much alcohol is in your drink. In the [place] one drink equals one typical bottle  of beer (12 oz), one-half glass of wine (5 oz), or one shot of hard liquor (1 oz).  Do not use any products that contain nicotine or tobacco, such as cigarettes and e-cigarettes. If you need help quitting, ask your health care provider. Summary  Having a healthy lifestyle and getting preventive care can help to protect your health and wellness after age 65.  Screening and testing are the best way to find a health problem early and help you avoid having a fall. Early diagnosis and treatment give you the best chance for managing medical conditions that are more common for people who are older than age 65.  Falls are a major cause of broken bones and head injuries in people who are older than age 65. Take precautions to prevent a fall at home.  Work with your health care provider to learn what changes you can make to improve your health and wellness and to prevent falls. This information is not intended to replace advice given to you by your health care provider. Make sure you discuss any questions you have with your health care provider. Document Revised: 09/30/2018 Document Reviewed: 04/22/2017 Elsevier Patient Education  2021 [employer]vier Inc.   If you have lab work done today you will be contacted with your lab results within the next 2 weeks.  If you have not heard from us then please contact us. The fastest way to get your results is to register for My Chart.   IF you received an x-ray today, you will receive an invoice from [facility]. Please contact [facility] at [phone] with questions or concerns regarding your invoice.   IF you received labwork today, you will receive an invoice from [facility]. Please contact [facility] at [phone] with questions or concerns regarding your invoice.   Our billing staff will not be able to assist you with questions regarding bills from these companies.  You will be contacted with the lab results as soon as they are available.  The fastest way to get your results is to activate your My Chart account. Instructions are located on the last page of this paperwork. If you have not heard from us regarding the results in 2 weeks, please contact this office.      

## 2020-07-17 NOTE — Progress Notes (Signed)
1/25/202212:31 PM  [place] Jul 15, 1955, 65 y.o., female [id number]  Chief Complaint  Patient presents with  . Hypertension    Medication refill   . Hyperlipidemia    Requests check fasting labs  . rash on back of knees    For over a month now    HPI:   Patient is a 65 y.o. female with past medical history significant for HTN, HLD who presents today for medication refills and rash.  HTN HCTZ 63m Takes BP at home 130/80  BP Readings from Last 3 Encounters:  07/17/20 (!) 156/84  06/07/19 137/84  06/07/19 140/79   HLD Pravastatin 257m took for 6 months Liver and kidney function were not ideal so stopped that medication  Lab Results  Component Value Date   CHOL 187 06/07/2019   HDL 57 06/07/2019   LDLCALC 113 (H) 06/07/2019   TRIG 92 06/07/2019   CHOLHDL 3.3 06/07/2019   The 10-year ASCVD risk score (GMikey BussingC Jr., et al., 2013) is: 12.7%   Values used to calculate the score:     Age: [age]     Sex: Female     Is Non-Hispanic African American: Yes     Diabetic: No     Tobacco smoker: No     Systolic Blood Pressure: 15790mHg     Is BP treated: Yes     HDL Cholesterol: 59 mg/dL     Total Cholesterol: 176 mg/dL   Takes meloxicam for arthritis Happy with pain regimen Has been on for years  Noticed a rash in December Was worried it was from clothes Was burning Inner elbows Used benadryl creams, and worked better Denies history allergies and eczema Uses coconut oil History of childhood asthma     [facility] 07/17[facility]2020 06/07/2019 03/08/2019  Decreased Interest 0 0 0  Down, Depressed, Hopeless 0 0 0  PHQ - 2 Score 0 0 0    Fall Risk  07/17/2020 06/07/2019 03/08/2019 11/23/2018 08/11/2018  Falls in the past year? 0 0 0 0 0  Number falls in past yr: 0 0 0 - -  Injury with Fall? 0 0 0 - -  Follow up Falls evaluation completed Falls evaluation completed Falls evaluation completed Falls evaluation completed -     No Known  Allergies  Prior to Admission medications   Medication Sig Start Date End Date Taking? Authorizing Provider  hydrochlorothiazide (HYDRODIURIL) 25 MG tablet TAKE 1 TABLET(25 MG) BY MOUTH DAILY 03/01/20   SaHorald PollenMD  meloxicam (MOBIC) 7.5 MG tablet TAKE 1 TABLET(7.5 MG) BY MOUTH DAILY AS NEEDED FOR PAIN 09/23/19   StForrest MoronMD  Multiple Vitamins-Minerals (CENTRUM SILVER 50+WOMEN PO) Take by mouth.    [provider]  pravastatin (PRAVACHOL) 20 MG tablet Take 1 tablet (20 mg total) by mouth every other day. 08/24/19   StForrest MoronMD    Past Medical History:  Diagnosis Date  . Hypertension     Past Surgical History:  Procedure Laterality Date  . CESAREAN SECTION    . TUBAL LIGATION      Social History   Tobacco Use  . Smoking status: Never Smoker  . Smokeless tobacco: Never Used  Substance Use Topics  . Alcohol use: No    Alcohol/week: 0.0 standard drinks    Family History  Problem Relation Age of Onset  . Arthritis Mother   . Hypertension Father   . Hypertension Brother     Review  of Systems  Constitutional: Negative for chills, fever and malaise/fatigue.  Eyes: Negative for blurred vision and double vision.  Respiratory: Negative for cough, shortness of breath and wheezing.   Cardiovascular: Negative for chest pain, palpitations and leg swelling.  Gastrointestinal: Negative for abdominal pain, blood in stool, constipation, diarrhea, heartburn, nausea and vomiting.  Genitourinary: Negative for dysuria, frequency and hematuria.  Musculoskeletal: Negative for back pain and joint pain.  Skin: Positive for itching and rash.  Neurological: Negative for dizziness, weakness and headaches.     OBJECTIVE:  Today's Vitals   07/17/20 1056  BP: (!) 156/84  Pulse: 78  Temp: 98 F (36.7 C)  SpO2: 100%  Weight: 173 lb (78.5 kg)  Height: 5' 5.5" (1.664 m)   Body mass index is 28.35 kg/m.   Physical Exam Constitutional:      General:  She is not in acute distress.    Appearance: Normal appearance. She is not ill-appearing.  HENT:     Head: Normocephalic.  Cardiovascular:     Rate and Rhythm: Normal rate and regular rhythm.     Pulses: Normal pulses.     Heart sounds: Normal heart sounds. No murmur heard. No friction rub. No gallop.   Pulmonary:     Effort: Pulmonary effort is normal. No respiratory distress.     Breath sounds: Normal breath sounds. No stridor. No wheezing, rhonchi or rales.  Abdominal:     General: Bowel sounds are normal.     Palpations: Abdomen is soft.     Tenderness: There is no abdominal tenderness.  Musculoskeletal:     Right lower leg: No edema.     Left lower leg: No edema.  Skin:    General: Skin is warm and dry.     Findings: No erythema or rash.  Neurological:     Mental Status: She is alert and oriented to person, place, and time.  Psychiatric:        Mood and Affect: Mood normal.        Behavior: Behavior normal.     No results found for this or any previous visit (from the past 24 hour(s)).  No results found.   ASSESSMENT and PLAN  Problem List Items Addressed This Visit   None   Visit Diagnoses    Eczema, unspecified type    -  Primary   Relevant Medications   triamcinolone (KENALOG) 0.1 %   Essential hypertension       Relevant Medications   hydrochlorothiazide (HYDRODIURIL) 25 MG tablet   Other Relevant Orders   CMP14+EGFR   Left shoulder pain, unspecified chronicity       Relevant Medications   meloxicam (MOBIC) 7.5 MG tablet   Dyslipidemia       Relevant Orders   Lipid panel   Encounter for vaccination       Screening for HIV (human immunodeficiency virus)       Relevant Orders   HIV antibody (with reflex)   Encounter for hepatitis C screening test for low risk patient       Relevant Orders   Hepatitis C antibody   Screening for diabetes mellitus       Relevant Orders   Hemoglobin A1c   Screening for thyroid disorder       Relevant Orders   TSH    Screening, anemia, deficiency, iron       Relevant Orders   CBC   Encounter for vitamin deficiency screening  Relevant Orders   Vitamin D, 25-hydroxy      Plan . Discussed r/se/b of CCB and ARBS will follow up at virtual visit . Discussed statins spec. Crestor will follow up with labs . Education given on eczema and use of oil based lubricants, steroid cream as needed . Will follow up with labs at next visit . Needs shingles and pneumonia vaccine . Refills sent for HCTZ and meloxicam . Records release signed for DEXA scan   Return in about 2 days (around 07/19/2020) for Virtual to discuss labs.    Huston Foley Holleigh Crihfield, FNP-BC Primary Care at [facility], [place] [address] Ph.  [phone] Fax [phone]

## 2020-07-18 LAB — CMP14+EGFR
ALT: 11 IU/L (ref 0–32)
AST: 20 IU/L (ref 0–40)
Albumin/Globulin Ratio: 1.5 (ref 1.2–2.2)
Albumin: 4.3 g/dL (ref 3.8–4.8)
Alkaline Phosphatase: 86 IU/L (ref 44–121)
BUN/Creatinine Ratio: 13 (ref 12–28)
BUN: 9 mg/dL (ref 8–27)
Bilirubin Total: 0.3 mg/dL (ref 0.0–1.2)
CO2: 25 mmol/L (ref 20–29)
Calcium: 9.4 mg/dL (ref 8.7–10.3)
Chloride: 104 mmol/L (ref 96–106)
Creatinine, Ser: 0.7 mg/dL (ref 0.57–1.00)
GFR calc Af Amer: 105 mL/min/{1.73_m2} (ref >59)
GFR calc non Af Amer: 91 mL/min/{1.73_m2} (ref >59)
Globulin, Total: 2.8 g/dL (ref 1.5–4.5)
Glucose: 91 mg/dL (ref 65–99)
Potassium: 3.7 mmol/L (ref 3.5–5.2)
Sodium: 142 mmol/L (ref 134–144)
Total Protein: 7.1 g/dL (ref 6.0–8.5)

## 2020-07-18 LAB — HEMOGLOBIN A1C
Est. average glucose Bld gHb Est-mCnc: 123 mg/dL
Hgb A1c MFr Bld: 5.9 % — ABNORMAL HIGH (ref 4.8–5.6)

## 2020-07-18 LAB — CBC
Hematocrit: 37.8 % (ref 34.0–46.6)
Hemoglobin: 12.6 g/dL (ref 11.1–15.9)
MCH: 28.8 pg (ref 26.6–33.0)
MCHC: 33.3 g/dL (ref 31.5–35.7)
MCV: 87 fL (ref 79–97)
Platelets: 272 10*3/uL (ref 150–450)
RBC: 4.37 x10E6/uL (ref 3.77–5.28)
RDW: 13.2 % (ref 11.7–15.4)
WBC: 5 10*3/uL (ref 3.4–10.8)

## 2020-07-18 LAB — LIPID PANEL
Chol/HDL Ratio: 3.7 ratio (ref 0.0–4.4)
Cholesterol, Total: 219 mg/dL — ABNORMAL HIGH (ref 100–199)
HDL: 59 mg/dL (ref >39)
LDL Chol Calc (NIH): 141 mg/dL — ABNORMAL HIGH (ref 0–99)
Triglycerides: 109 mg/dL (ref 0–149)
VLDL Cholesterol Cal: 19 mg/dL (ref 5–40)

## 2020-07-18 LAB — TSH: TSH: 1.31 u[IU]/mL (ref 0.450–4.500)

## 2020-07-18 LAB — VITAMIN D 25 HYDROXY (VIT D DEFICIENCY, FRACTURES): Vit D, 25-Hydroxy: 49.6 ng/mL (ref 30.0–100.0)

## 2020-07-19 ENCOUNTER — Other Ambulatory Visit: Payer: Self-pay

## 2020-07-19 ENCOUNTER — Encounter: Payer: Self-pay | Admitting: Family Medicine

## 2020-07-19 ENCOUNTER — Telehealth (INDEPENDENT_AMBULATORY_CARE_PROVIDER_SITE_OTHER): Payer: Medicare Other | Admitting: Family Medicine

## 2020-07-19 DIAGNOSIS — R7303 Prediabetes: Secondary | ICD-10-CM

## 2020-07-19 DIAGNOSIS — I2510 Atherosclerotic heart disease of native coronary artery without angina pectoris: Secondary | ICD-10-CM | POA: Diagnosis not present

## 2020-07-19 MED ORDER — ROSUVASTATIN CALCIUM 10 MG PO TABS: 10.0000 mg | ORAL_TABLET | Freq: Every day | ORAL | 3 refills | Status: DC

## 2020-07-19 NOTE — Progress Notes (Signed)
Virtual Visit Note  I connected with patient on 07/19/20 at 0946 by telephone due to unable to work Epic video visit and verified that I am speaking with the correct person using two identifiers. Audrey Wells is currently located at home and no family members are currently with them during visit. The provider, Laurita Quint Myrna Vonseggern, FNP is located in their office at time of visit.  I discussed the limitations, risks, security and privacy concerns of performing an evaluation and management service by telephone and the availability of in person appointments. I also discussed with the patient that there may be a patient responsible charge related to this service. The patient expressed understanding and agreed to proceed.   I provided 20 minutes of non-face-to-face time during this encounter.  Chief Complaint  Patient presents with  . lab results     cholesterol    HPI ? HTN HCTZ 25mg  BP Readings from Last 3 Encounters:  07/17/20 (!) 156/84  06/07/19 137/84  06/07/19 140/79    HLD LDL increased from 113 to 141 Total from 187 to 219 Had pravastatin in the past  Lab Results  Component Value Date   CHOL 219 (H) 07/17/2020   HDL 59 07/17/2020   LDLCALC 141 (H) 07/17/2020   TRIG 109 07/17/2020   CHOLHDL 3.7 07/17/2020   The 10-year ASCVD risk score Mikey Bussing DC Jr., et al., 2013) is: 15.3%   Values used to calculate the score:     Age: 65 years     Sex: Female     Is Non-Hispanic African American: Yes     Diabetic: No     Tobacco smoker: No     Systolic Blood Pressure: 601 mmHg     Is BP treated: Yes     HDL Cholesterol: 59 mg/dL     Total Cholesterol: 219 mg/dL  Prediabetes  Lab Results  Component Value Date   HGBA1C 5.9 (H) 07/17/2020   HGBA1C 5.9 (H) 08/22/2017   Lab Results  Component Value Date   LDLCALC 141 (H) 07/17/2020   CREATININE 0.70 07/17/2020    No Known Allergies  Prior to Admission medications   Medication Sig Start Date End Date Taking?  Authorizing Provider  hydrochlorothiazide (HYDRODIURIL) 25 MG tablet TAKE 1 TABLET(25 MG) BY MOUTH DAILY 07/17/20  Yes Rayvon Brandvold, Laurita Quint, FNP  meloxicam (MOBIC) 7.5 MG tablet Take 1 tablet (7.5 mg total) by mouth daily. 07/17/20  Yes Kimori Tartaglia, Laurita Quint, FNP  Multiple Vitamins-Minerals (CENTRUM SILVER 50+WOMEN PO) Take by mouth.   Yes [provider]  triamcinolone (KENALOG) 0.1 % Apply 1 application topically 2 (two) times daily. 07/17/20  Yes Giana Castner, Laurita Quint, FNP    Past Medical History:  Diagnosis Date  . Hypertension     Past Surgical History:  Procedure Laterality Date  . CESAREAN SECTION    . TUBAL LIGATION      Social History   Tobacco Use  . Smoking status: Never Smoker  . Smokeless tobacco: Never Used  Substance Use Topics  . Alcohol use: No    Alcohol/week: 0.0 standard drinks    Family History  Problem Relation Age of Onset  . Arthritis Mother   . Hypertension Father   . Hypertension Brother     Review of Systems  Respiratory: Negative for cough and shortness of breath.   Cardiovascular: Negative for chest pain, palpitations and leg swelling.  Gastrointestinal: Negative for abdominal pain, constipation, diarrhea, nausea and vomiting.  Neurological: Negative for headaches.  Objective  Constitutional:      General: Not in acute distress.    Appearance: Normal appearance. Not ill-appearing.   Pulmonary:     Effort: Pulmonary effort is normal. No respiratory distress.  Neurological:     Mental Status: Alert and oriented to person, place, and time.  Psychiatric:        Mood and Affect: Mood normal.        Behavior: Behavior normal.     ASSESSMENT and PLAN  Problem List Items Addressed This Visit   None   Visit Diagnoses    Pre-diabetes    -  Primary   Atherosclerotic cardiovascular disease       Relevant Medications   rosuvastatin (CRESTOR) 10 MG tablet      Plan  Discussed r/se/b of CCB and ARBS will follow up at virtual visit . Starting  Crestor discussed r/se/b . Will start working on LFM  Declined BP medications and nutritionist at this time  Recheck CMP next visit  Return in about 3 months (around 10/17/2020).    The above assessment and management plan was discussed with the patient. The patient verbalized understanding of and has agreed to the management plan. Patient is aware to call the clinic if symptoms persist or worsen. Patient is aware when to return to the clinic for a follow-up visit. Patient educated on when it is appropriate to go to the emergency department.     Huston Foley Coner Gibbard, FNP-BC Primary Care at [place], [place] [address] Ph.  [phone] Fax [phone]

## 2020-07-19 NOTE — Patient Instructions (Signed)

## 2020-07-20 ENCOUNTER — Other Ambulatory Visit: Payer: Self-pay | Admitting: General Practice

## 2020-07-20 DIAGNOSIS — L309 Dermatitis, unspecified: Secondary | ICD-10-CM

## 2020-07-20 NOTE — Telephone Encounter (Signed)
Copied from [place] [id number]. Topic: Quick Communication - Rx Refill/Question >> Jul 20, 2020  1:38 PM Tessa Lerner A wrote: Medication: triamcinolone (KENALOG) 0.1 %  Has the patient contacted their pharmacy? Yes. Patient was directed to contact PCP  Preferred Pharmacy (with phone number or street name): [id number] [address], [place] AT [address] Phone: [phone]  Agent: Please be advised that RX refills may take up to 3 business days. We ask that you follow-up with your pharmacy.

## 2020-07-23 ENCOUNTER — Other Ambulatory Visit: Payer: Self-pay | Admitting: Family Medicine

## 2020-07-23 DIAGNOSIS — M810 Age-related osteoporosis without current pathological fracture: Secondary | ICD-10-CM

## 2020-07-27 ENCOUNTER — Telehealth: Payer: Self-pay | Admitting: Family Medicine

## 2020-07-27 NOTE — Telephone Encounter (Signed)
This must be old we had a telemed visit on 1/27 to specifically discuss her lab results and start the crestor. Does she need another call?

## 2020-07-27 NOTE — Telephone Encounter (Signed)
Pt is stating this is the 3rd time she has called in for this concern. She would like a cb concerning her lab results and her taking Crestor. Please advise at [phone]

## 2020-07-27 NOTE — Telephone Encounter (Signed)
No notes about other 2 calls unsure why the message didn't come to [place].   Pt requesting results from 07/17/2020 lab work

## 2020-07-30 ENCOUNTER — Telehealth: Payer: Self-pay

## 2020-07-30 NOTE — Telephone Encounter (Signed)
Most recent labs mailed to pt home

## 2020-10-11 LAB — HM DEXA SCAN

## 2020-12-24 IMAGING — US US BREAST BX W LOC DEV 1ST LESION IMG BX SPEC US GUIDE*R*
1 series · 12 of 16 positions shown · non-contrast
Comparison: Previous exam(s).
COMPARISON: Previous exam(s).

Addendum:
CLINICAL DATA: Patient with indeterminate right breast masses.

EXAM:
ULTRASOUND GUIDED RIGHT BREAST CORE NEEDLE BIOPSY

[Series 1: us breast bx w loc dev 1st lesion img bx spec us g · 0.05mm/px · 12 of 16 slices shown]
[im 1/16]
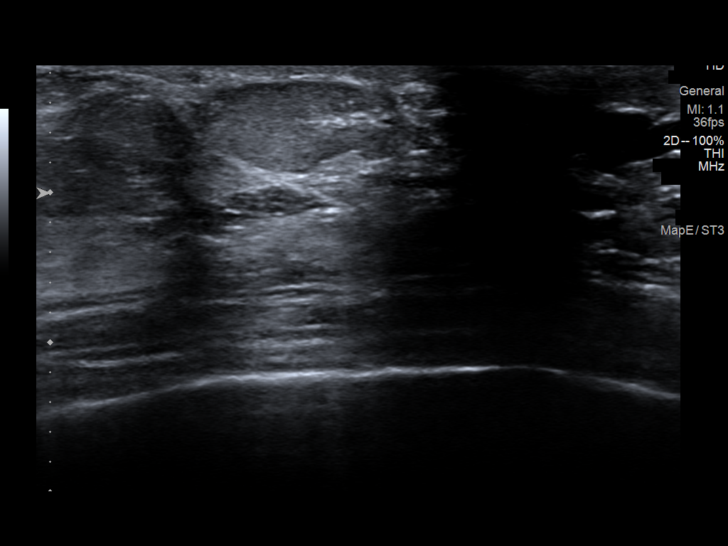
[im 3/16]
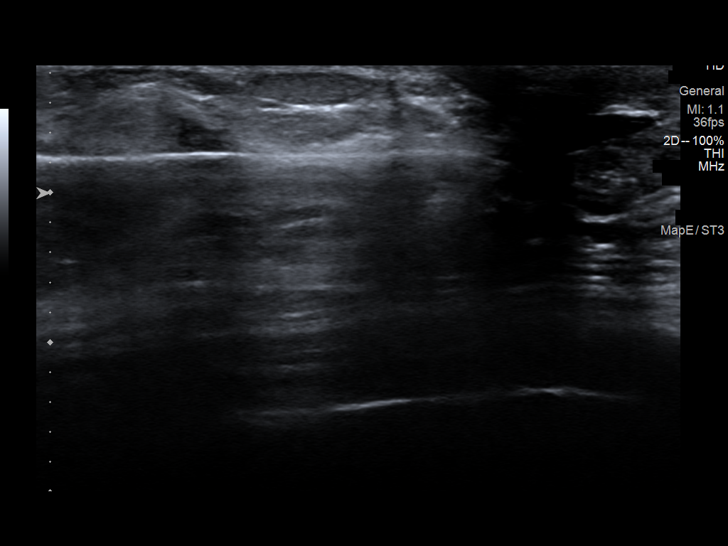
[im 4/16]
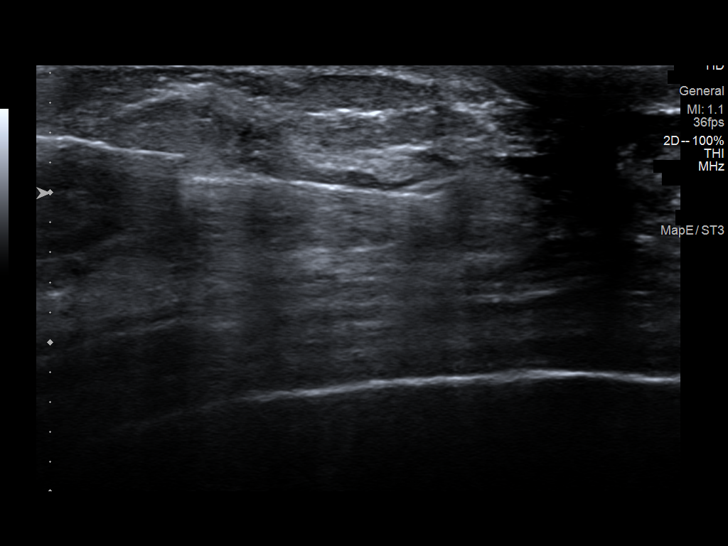
[im 5/16]
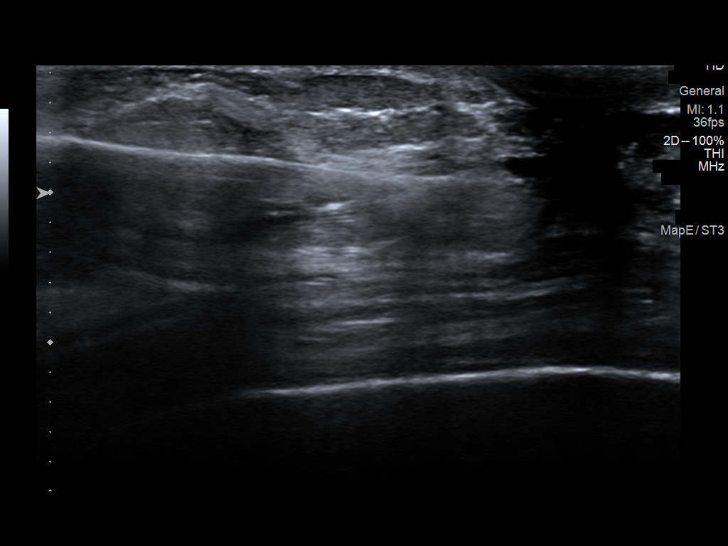
[im 7/16]
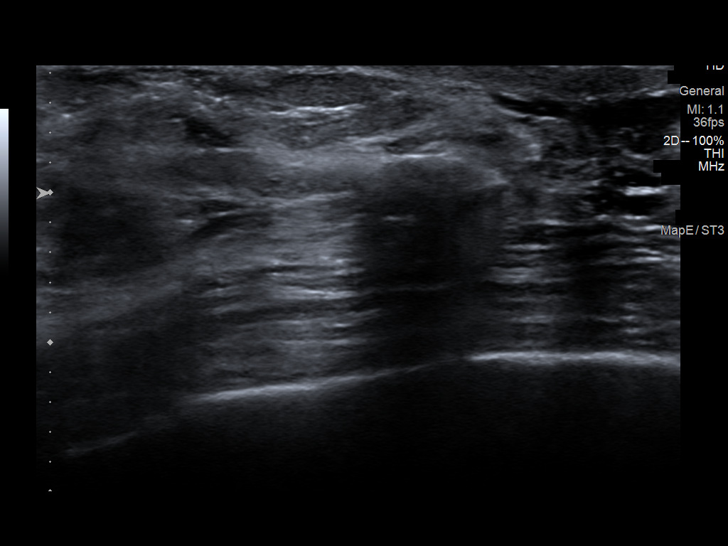
[im 8/16]
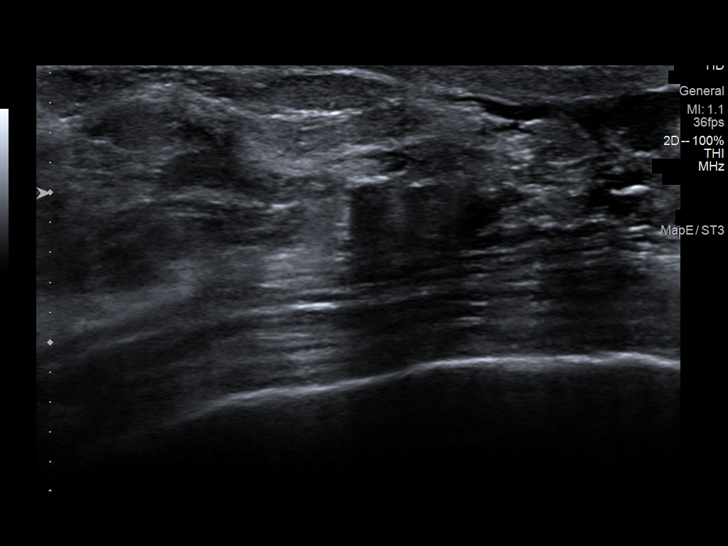
[im 9/16]
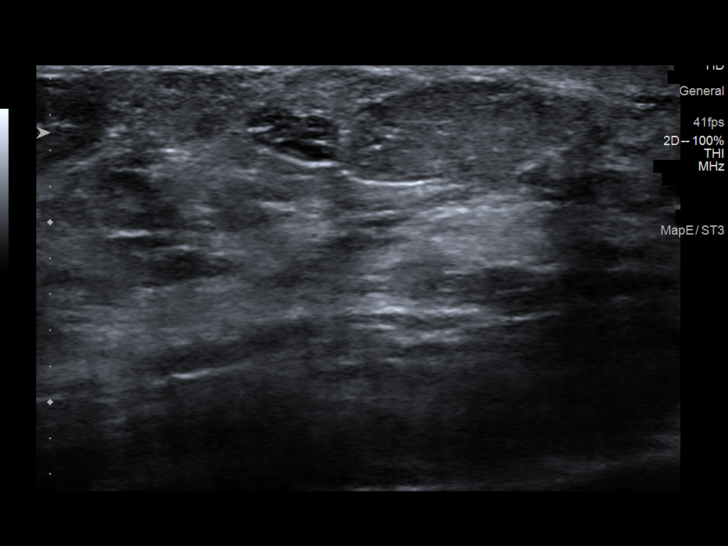
[im 11/16]
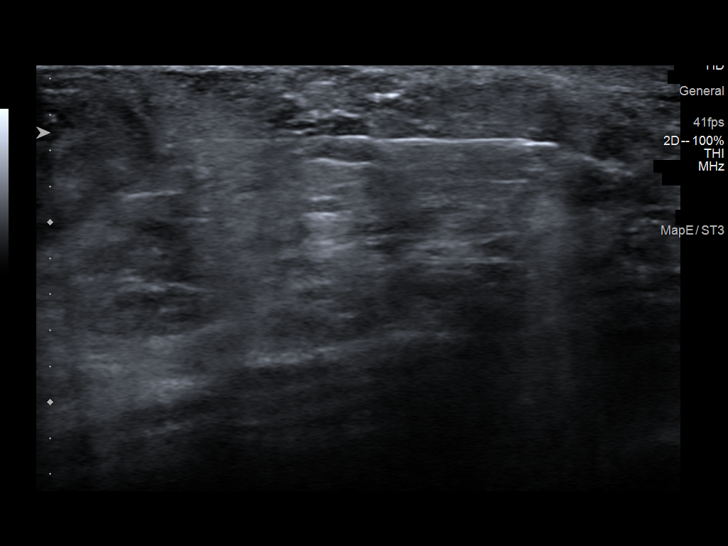
[im 12/16]
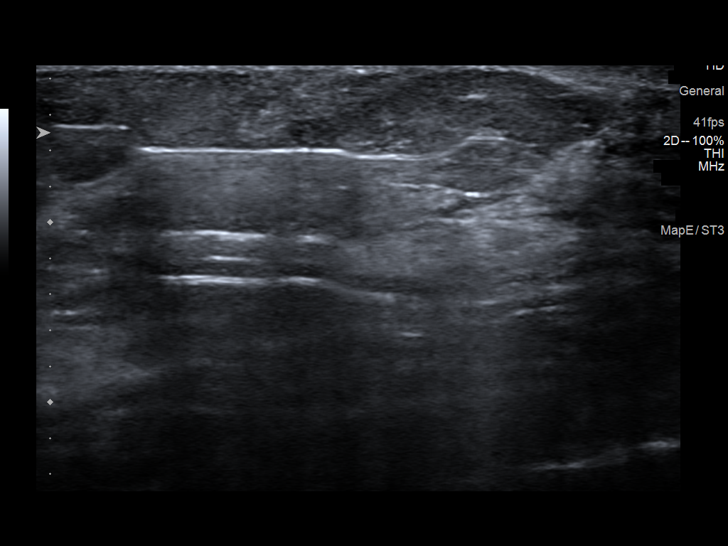
[im 13/16]
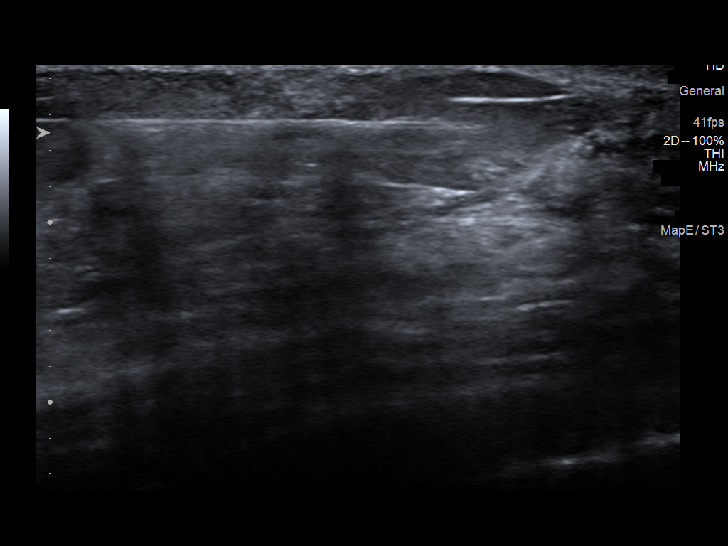
[im 15/16]
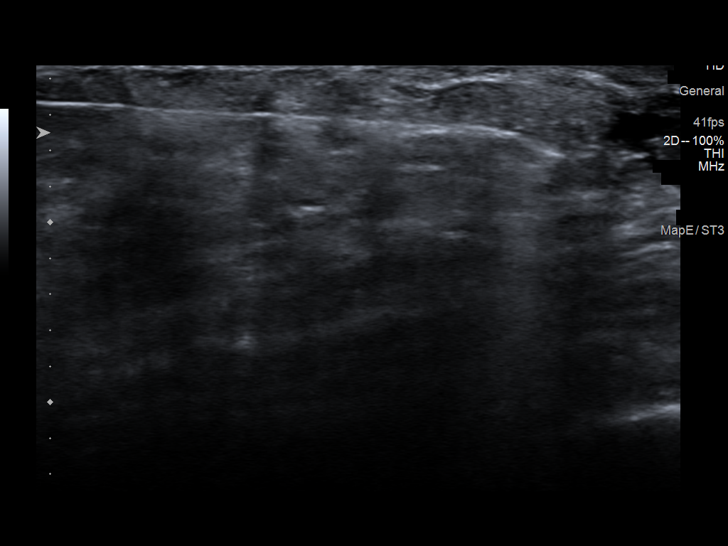
[im 16/16]
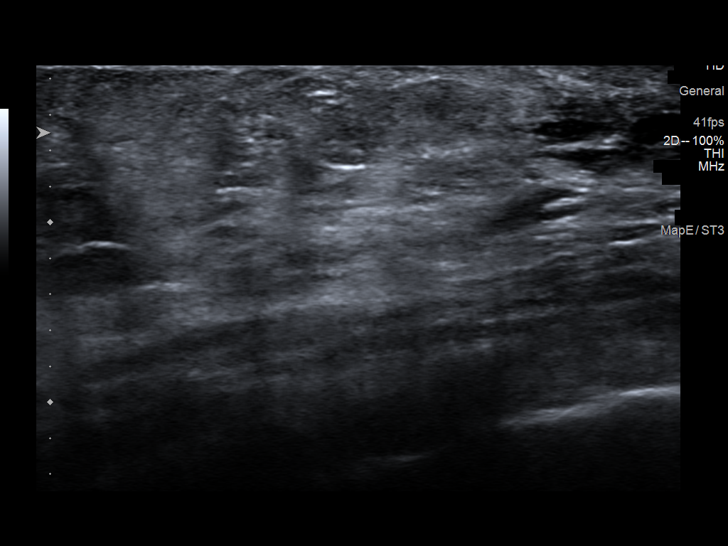

[12 of 16 positions shown; findings below may reference images not displayed]



Site 1: Right breast 3 o'clock position: Ribbon clip.

Lesion quadrant: Lower inner quadrant

Using sterile technique and 1% Lidocaine as local anesthetic, under
direct ultrasound visualization, a 14 gauge Rayan device was
used to perform biopsy of right breast mass 3 o'clock position using
a medial approach. At the conclusion of the procedure a ribbon
shaped tissue marker clip was deployed into the biopsy cavity.
Follow up 2 view mammogram was performed and dictated separately.

Site 2: Right breast 9 o'clock position: Coil clip.

Lesion quadrant: Upper outer quadrant

Using sterile technique and 1% Lidocaine as local anesthetic, under
direct ultrasound visualization, a 14 gauge Rayan device was
used to perform biopsy of right breast mass 9 o'clock position using
a lateral approach. At the conclusion of the procedure a coil shaped
tissue marker clip was deployed into the biopsy cavity. Follow up 2
view mammogram was performed and dictated separately.
IMPRESSION: Ultrasound guided biopsy of right breast mass 9 o'clock position and
right breast mass 3 o'clock position. No apparent complications.

ADDENDUM:
Pathology revealed FIBROADENOMA of the Right breast, 3 o'clock

Pathology revealed BENIGN BREAST PARENCHYMA WITH FIBROCYSTIC CHANGE,
INCLUDING APOCRINE METAPLASIA of the Right breast, 9 o'clock. This
was found to be concordant by Dr. Jeffrey L Tojin.

Pathology results were discussed with the patient by telephone. The
patient reported doing well after the biopsies with tenderness at
the sites. Post biopsy instructions and care were reviewed and
questions were answered. The patient was encouraged to call The

The patient was asked to return for right diagnostic mammography and
possible ultrasound in 6 months and informed a reminder notice would
be sent regarding this appointment.

Pathology results reported by Eyebiokin Salifu, RN on 10/29/2018.



Site 1: Right breast 3 o'clock position: Ribbon clip.

Lesion quadrant: Lower inner quadrant

Using sterile technique and 1% Lidocaine as local anesthetic, under
direct ultrasound visualization, a 14 gauge Rayan device was
used to perform biopsy of right breast mass 3 o'clock position using
a medial approach. At the conclusion of the procedure a ribbon
shaped tissue marker clip was deployed into the biopsy cavity.
Follow up 2 view mammogram was performed and dictated separately.

Site 2: Right breast 9 o'clock position: Coil clip.

Lesion quadrant: Upper outer quadrant

Using sterile technique and 1% Lidocaine as local anesthetic, under
direct ultrasound visualization, a 14 gauge Rayan device was
used to perform biopsy of right breast mass 9 o'clock position using
a lateral approach. At the conclusion of the procedure a coil shaped
tissue marker clip was deployed into the biopsy cavity. Follow up 2
view mammogram was performed and dictated separately.
IMPRESSION: Ultrasound guided biopsy of right breast mass 9 o'clock position and
right breast mass 3 o'clock position. No apparent complications.

## 2021-01-18 LAB — HM MAMMOGRAPHY

## 2021-01-23 ENCOUNTER — Emergency Department (HOSPITAL_COMMUNITY)
Admission: EM | Admit: 2021-01-23 | Discharge: 2021-01-23 | Disposition: A | Discharge status: Home / Self Care | Payer: Medicare Other | Attending: Emergency Medicine | Admitting: Emergency Medicine

## 2021-01-23 ENCOUNTER — Other Ambulatory Visit: Payer: Self-pay

## 2021-01-23 ENCOUNTER — Encounter (HOSPITAL_COMMUNITY): Payer: Self-pay | Admitting: *Deleted

## 2021-01-23 ENCOUNTER — Encounter (HOSPITAL_COMMUNITY): Payer: Self-pay

## 2021-01-23 ENCOUNTER — Emergency Department (HOSPITAL_COMMUNITY): Payer: Medicare Other

## 2021-01-23 ENCOUNTER — Ambulatory Visit (HOSPITAL_COMMUNITY)
Admission: EM | Admit: 2021-01-23 | Discharge: 2021-01-23 | Disposition: A | Discharge status: Home / Self Care | Payer: Medicare Other | Attending: Student | Admitting: Student

## 2021-01-23 DIAGNOSIS — R9431 Abnormal electrocardiogram [ECG] [EKG]: Secondary | ICD-10-CM | POA: Diagnosis not present

## 2021-01-23 DIAGNOSIS — I10 Essential (primary) hypertension: Secondary | ICD-10-CM | POA: Diagnosis not present

## 2021-01-23 DIAGNOSIS — Z79899 Other long term (current) drug therapy: Secondary | ICD-10-CM | POA: Insufficient documentation

## 2021-01-23 DIAGNOSIS — R220 Localized swelling, mass and lump, head: Secondary | ICD-10-CM | POA: Diagnosis not present

## 2021-01-23 DIAGNOSIS — R0789 Other chest pain: Secondary | ICD-10-CM | POA: Diagnosis present

## 2021-01-23 DIAGNOSIS — R079 Chest pain, unspecified: Secondary | ICD-10-CM

## 2021-01-23 DIAGNOSIS — R072 Precordial pain: Secondary | ICD-10-CM | POA: Diagnosis not present

## 2021-01-23 LAB — CBC
HCT: 38.7 % (ref 36.0–46.0)
Hemoglobin: 12.6 g/dL (ref 12.0–15.0)
MCH: 28.4 pg (ref 26.0–34.0)
MCHC: 32.6 g/dL (ref 30.0–36.0)
MCV: 87.2 fL (ref 80.0–100.0)
Platelets: 259 10*3/uL (ref 150–400)
RBC: 4.44 MIL/uL (ref 3.87–5.11)
RDW: 13.1 % (ref 11.5–15.5)
WBC: 6.3 10*3/uL (ref 4.0–10.5)
nRBC: 0 % (ref 0.0–0.2)

## 2021-01-23 LAB — BASIC METABOLIC PANEL
Anion gap: 8 (ref 5–15)
BUN: 7 mg/dL — ABNORMAL LOW (ref 8–23)
CO2: 29 mmol/L (ref 22–32)
Calcium: 9.5 mg/dL (ref 8.9–10.3)
Chloride: 103 mmol/L (ref 98–111)
Creatinine, Ser: 0.69 mg/dL (ref 0.44–1.00)
GFR, Estimated: >60 mL/min (ref >60)
Glucose, Bld: 101 mg/dL — ABNORMAL HIGH (ref 70–99)
Potassium: 3.5 mmol/L (ref 3.5–5.1)
Sodium: 140 mmol/L (ref 135–145)

## 2021-01-23 LAB — TROPONIN I (HIGH SENSITIVITY)
Troponin I (High Sensitivity): 3 ng/L (ref <18)
Troponin I (High Sensitivity): 3 ng/L (ref <18)

## 2021-01-23 NOTE — Discharge Instructions (Addendum)
You were evaluated in the ED for chest pain after single vomiting episode.  Your blood work and EKG were reassuring that nothing more concerning is going on besides some residual pain from reflux. When pressure builds up during vomiting, sometimes our blood vessels leak and cause bruising. The bruising under your tongue should dissipate overtime. If it persists please see your primary care physician for re-evaluation of the tongue.   Please avoid spicy and acidic foods for the time being. You can take medication for heart burn if you have a return of symptoms. If you have worsening of symptoms of chest pain, nausea vomiting, please return for re-evaluation.

## 2021-01-23 NOTE — Discharge Instructions (Addendum)
-  Please head to the emergency department for further evaluation and management of your chest pain.  I cannot rule out an acute cardiac event like a heart attack in the urgent care setting, and there are changes on your EKG that are concerning.  Please head straight to [facility] emergency room, if symptoms get worse on the way like chest pain, dizziness, shortness of breath, weakness-stop and call 911 immediately.

## 2021-01-23 NOTE — ED Provider Notes (Signed)
[place]    CSN: [id number] Arrival date & time: 01/23/21  0903      History   Chief Complaint Chief Complaint  Patient presents with   Emesis   Chest Pain    HPI Audrey Wells is a 65 y.o. female presenting with vomiting and chest pain one day ago that is improved today. Medical history hypertension.  Endorses 1 unprovoked episode of epigastric burning with bilious vomiting.  Following the episode, had about 15 minutes of burning left-sided chest pain.  States that the symptoms have resolved, but she is still feeling lightheaded, worse with standing and ambulation.  Denies current chest pain, weakness, dizziness, shortness of breath, headaches, vision changes.  HPI  Past Medical History:  Diagnosis Date   Hypertension     Patient Active Problem List   Diagnosis Date Noted   HTN (hypertension) 01/24/2017   Elevated LDL cholesterol level 01/24/2017    Past Surgical History:  Procedure Laterality Date   CESAREAN SECTION     TUBAL LIGATION      OB History   No obstetric history on file.      Home Medications    Prior to Admission medications   Medication Sig Start Date End Date Taking? Authorizing Provider  hydrochlorothiazide (HYDRODIURIL) 25 MG tablet TAKE 1 TABLET(25 MG) BY MOUTH DAILY 07/17/20  Yes Just, Laurita Quint, FNP  meloxicam (MOBIC) 7.5 MG tablet Take 1 tablet (7.5 mg total) by mouth daily. 07/17/20  Yes Just, Laurita Quint, FNP  triamcinolone (KENALOG) 0.1 % Apply 1 application topically 2 (two) times daily. 07/17/20  Yes Just, Laurita Quint, FNP  Multiple Vitamins-Minerals (CENTRUM SILVER 50+WOMEN PO) Take by mouth.    [provider]  rosuvastatin (CRESTOR) 10 MG tablet Take 1 tablet (10 mg total) by mouth daily. 07/19/20   Just, Laurita Quint, FNP    Family History Family History  Problem Relation Age of Onset   Arthritis Mother    Hypertension Father    Hypertension Brother     Social History Social History   Tobacco Use   Smoking  status: Never   Smokeless tobacco: Never  Vaping Use   Vaping Use: Never used  Substance Use Topics   Alcohol use: No    Alcohol/week: 0.0 standard drinks   Drug use: No     Allergies   Patient has no known allergies.   Review of Systems Review of Systems  Respiratory:  Positive for chest tightness.   All other systems reviewed and are negative.   Physical Exam Triage Vital Signs ED Triage Vitals  Enc Vitals Group     BP 01/23/21 0917 129/77     Pulse Rate 01/23/21 0917 77     Resp 01/23/21 0917 18     Temp 01/23/21 0917 98.4 F (36.9 C)     Temp src --      SpO2 01/23/21 0917 98 %     Weight --      Height --      Head Circumference --      Peak Flow --      Pain Score 01/23/21 0914 0     Pain Loc --      Pain Edu? --      Excl. in [place]? --    No data found.  Updated Vital Signs BP 129/77   Pulse 77   Temp 98.4 F (36.9 C)   Resp 18   SpO2 98%   Visual Acuity Right Eye  Distance:   Left Eye Distance:   Bilateral Distance:    Right Eye Near:   Left Eye Near:    Bilateral Near:     Physical Exam Vitals reviewed.  Constitutional:      Appearance: Normal appearance. She is not diaphoretic.  HENT:     Head: Normocephalic and atraumatic.     Mouth/Throat:     Mouth: Mucous membranes are moist.  Eyes:     Extraocular Movements: Extraocular movements intact.     Pupils: Pupils are equal, round, and reactive to light.  Cardiovascular:     Rate and Rhythm: Normal rate and regular rhythm.     Pulses:          Radial pulses are 2+ on the right side and 2+ on the left side.     Heart sounds: Normal heart sounds.  Pulmonary:     Effort: Pulmonary effort is normal.     Breath sounds: Normal breath sounds.  Chest:     Chest wall: No tenderness.  Abdominal:     Palpations: Abdomen is soft.     Tenderness: There is no abdominal tenderness. There is no guarding or rebound.  Musculoskeletal:     Right lower leg: No edema.     Left lower leg: No edema.   Skin:    General: Skin is warm.     Capillary Refill: Capillary refill takes less than 2 seconds.  Neurological:     General: No focal deficit present.     Mental Status: She is alert and oriented to person, place, and time.  Psychiatric:        Mood and Affect: Mood normal.        Behavior: Behavior normal.        Thought Content: Thought content normal.        Judgment: Judgment normal.     UC Treatments / Results  Labs (all labs ordered are listed, but only abnormal results are displayed) Labs Reviewed - No data to display  EKG   Radiology No results found.  Procedures Procedures (including critical care time)  Medications Ordered in UC Medications - No data to display  Initial Impression / Assessment and Plan / UC Course  I have reviewed the triage vital signs and the nursing notes.  Pertinent labs & imaging results that were available during my care of the patient were reviewed by me and considered in my medical decision making (see chart for details).     This patient is a very pleasant 65 y.o. year old female presenting with chest pain and vomiting (improving) and lightheadedness (current).   EKG with t-wave inversions, changed from 2020 EKG.  I am recommending this patient head straight to the emergency department for further evaluation and management, including cardiac work-up that we are unable to perform in the urgent care setting.  She verbalizes understanding and agreement, and is hemodynamically stable for transport in personal vehicle at this time.  Final Clinical Impressions(s) / UC Diagnoses   Final diagnoses:  Chest pain, unspecified type  Abnormal EKG     Discharge Instructions      -Please head to the emergency department for further evaluation and management of your chest pain.  I cannot rule out an acute cardiac event like a heart attack in the urgent care setting, and there are changes on your EKG that are concerning.  Please head  straight to [facility] emergency room, if symptoms get worse on the way like  chest pain, dizziness, shortness of breath, weakness-stop and call 911 immediately.     ED Prescriptions   None    PDMP not reviewed this encounter.   Hazel Sams, PA-C 01/23/21 [phone]

## 2021-01-23 NOTE — ED Notes (Signed)
EDP at the bedside discussing care.

## 2021-01-23 NOTE — ED Provider Notes (Signed)
[facility] EMERGENCY DEPARTMENT Provider Note   CSN: [id number] Arrival date & time: 01/23/21  1024     History No chief complaint on file.   Audrey Wells is a 65 y.o. female.  Audrey Wells is a 65 yo female with Pmhx of HTN, HLD, arthritis who presented to [facility] ED for chest pain. Her chest pain started yesterday evening. She was eating dinner, became suddenly nauseous, and went to the restroom to vomit. She had a single bout of small volume non bloody emesis and several bouts of dry heaving. She had associated chest pain that felt sharp, located sub sternal and left sided which persisted after the vomiting episode. She also had a sore throat. She used an ice pack on her chest which improved her pain. The next morning she had similar chest pain upon waking and noticed swelling under her tongue. This prompted her to go to urgent care, they did an EKG and sent her to the ED for evaluation of chest pain. Currently in the ED she denies chest pain, sore throat, nausea, vomiting, abdominal pain, swollen cervical lymph nodes, flu like symptoms, cough, SOB.        Past Medical History:  Diagnosis Date   Hypertension     Patient Active Problem List   Diagnosis Date Noted   HTN (hypertension) 01/24/2017   Elevated LDL cholesterol level 01/24/2017    Past Surgical History:  Procedure Laterality Date   CESAREAN SECTION     TUBAL LIGATION       OB History   No obstetric history on file.     Family History  Problem Relation Age of Onset   Arthritis Mother    Hypertension Father    Hypertension Brother     Social History   Tobacco Use   Smoking status: Never   Smokeless tobacco: Never  Vaping Use   Vaping Use: Never used  Substance Use Topics   Alcohol use: No    Alcohol/week: 0.0 standard drinks   Drug use: No    Home Medications Prior to Admission medications   Medication Sig Start Date End Date Taking? Authorizing Provider   hydrochlorothiazide (HYDRODIURIL) 25 MG tablet TAKE 1 TABLET(25 MG) BY MOUTH DAILY 07/17/20   Just, Laurita Quint, FNP  meloxicam (MOBIC) 7.5 MG tablet Take 1 tablet (7.5 mg total) by mouth daily. 07/17/20   Just, Laurita Quint, FNP  Multiple Vitamins-Minerals (CENTRUM SILVER 50+WOMEN PO) Take by mouth.    [provider]  rosuvastatin (CRESTOR) 10 MG tablet Take 1 tablet (10 mg total) by mouth daily. 07/19/20   Just, Laurita Quint, FNP  triamcinolone (KENALOG) 0.1 % Apply 1 application topically 2 (two) times daily. 07/17/20   Just, Laurita Quint, FNP    Allergies    Patient has no known allergies.  Review of Systems   Review of Systems  Constitutional:  Negative for fever and unexpected weight change.  HENT:  Negative for congestion, rhinorrhea and sore throat.   Respiratory:  Negative for cough and shortness of breath.   Cardiovascular:  Negative for chest pain, palpitations and leg swelling.  Gastrointestinal:  Negative for abdominal pain, constipation, diarrhea, nausea and vomiting.  Genitourinary:  Negative for dysuria and frequency.   Physical Exam Updated Vital Signs BP (!) 147/95   Pulse 75   Temp 98.3 F (36.8 C) (Oral)   Resp (!) 21   Ht '5\' 6"'$  (1.676 m)   Wt 77.1 kg   SpO2 100%  BMI 27.44 kg/m   Physical Exam Constitutional:      General: She is not in acute distress.    Appearance: She is not ill-appearing or toxic-appearing.  HENT:     Head: Normocephalic and atraumatic.     Comments: Swelling of vessels under tongue with possible bruising     Mouth/Throat:     Mouth: Mucous membranes are moist.     Pharynx: No posterior oropharyngeal erythema.  Eyes:     Conjunctiva/sclera:     Right eye: No hemorrhage.    Left eye: No hemorrhage. Cardiovascular:     Rate and Rhythm: Normal rate and regular rhythm.     Pulses: Normal pulses.     Heart sounds: No murmur heard.   No friction rub. No gallop.  Pulmonary:     Effort: Pulmonary effort is normal. No respiratory  distress.  Abdominal:     General: Bowel sounds are normal. There is no distension.     Palpations: Abdomen is soft.     Tenderness: There is no abdominal tenderness.  Musculoskeletal:     Comments: Trace lower extremity edema  Lymphadenopathy:     Cervical: No cervical adenopathy.  Skin:    General: Skin is warm and dry.  Neurological:     Mental Status: She is alert and oriented to person, place, and time. Mental status is at baseline.  Psychiatric:        Mood and Affect: Mood normal.        Behavior: Behavior normal.    ED Results / Procedures / Treatments   Labs (all labs ordered are listed, but only abnormal results are displayed) Labs Reviewed  BASIC METABOLIC PANEL - Abnormal; Notable for the following components:      Result Value   Glucose, Bld 101 (*)    BUN 7 (*)    All other components within normal limits  CBC  TROPONIN I (HIGH SENSITIVITY)  TROPONIN I (HIGH SENSITIVITY)    EKG None  Radiology DG Chest 2 View  Result Date: 01/23/2021 CLINICAL DATA:  Chest pain EXAM: CHEST - 2 VIEW COMPARISON:  04/12/2013 FINDINGS: The heart size and mediastinal contours are within normal limits. Both lungs are clear. The visualized skeletal structures are unremarkable. IMPRESSION: No acute abnormality of the lungs. Electronically Signed   By: Eddie Candle M.D.   On: 01/23/2021 11:14    Procedures Procedures  No procedures performed this visit  Medications Ordered in ED Medications - No data to display  ED Course  I have reviewed the triage vital signs and the nursing notes.  Pertinent labs & imaging results that were available during my care of the patient were reviewed by me and considered in my medical decision making (see chart for details).    MDM Rules/Calculators/A&P                         Audrey Wells is a 65 yo female with Pmhx of HTN, HLD, arthritis who presented to [facility] ED for 1 day history of substernal chest pain after single episode of nonbloody  small volume emesis yesterday. Vitals on arrival she was afebrile and normotensive. Her EKG was reassuring, NSR no ST elevations. Troponins were negative. Less likely that patient is having acute cardiac ischemia. CXR with normal mediastinal borders without pneumomediastinum. CBC and BMP unremarkable. Low concern for esophageal rupture. She denies chest pain, nausea, vomiting, sore throat at this time. Etiology of her pain  is likely musculoskeletal or reflux secondary to vomiting which has self resolved at this time. Patient's main concern is the swelling under her tongue. Likely the swelling is engorged blood vessels or bruising after increase in pressure following emesis and straining. She [place] asymptomatic and stable for discharge with no further workup in the ED required. Patient was reassured that there are no acute processes which need to be treated in the ED. She has heart burn medication at home which she was instructed to try if she does have similar symptoms. She was reassured that the swelling under her tongue should dissipate. If it does not then she should follow up with her primary care physician to further evaluate.   Final Clinical Impression(s) / ED Diagnoses Final diagnoses:  Precordial chest pain    Rx / DC Orders ED Discharge Orders     None        Wayland Denis, MD 01/23/21 1624    Margette Fast, MD 01/24/21 1123

## 2021-01-23 NOTE — ED Triage Notes (Signed)
Patient reports that yesterday afternoon she became nauseous and forced herself to vomit. Since that time has had CP. Sent from [facility] for further evaluation. No associated symptoms

## 2021-01-23 NOTE — ED Notes (Addendum)
Pt stated she was eating supper/dinner when she felt pain in the center of her chest. She described it feeling like pins needles and hot pain. She felt like she had to vomit so she attempted. Pt has a nickel size red knot under tongue. Pt states the knot under her tongue is not painful.

## 2021-01-23 NOTE — ED Triage Notes (Signed)
Pt reports yesterday she had a strong forceful vomiting with CP. Pt denies any CO or vomiting today.

## 2021-03-29 ENCOUNTER — Other Ambulatory Visit: Payer: Self-pay

## 2021-04-01 ENCOUNTER — Ambulatory Visit (INDEPENDENT_AMBULATORY_CARE_PROVIDER_SITE_OTHER): Payer: Medicare Other | Admitting: Nurse Practitioner

## 2021-04-01 ENCOUNTER — Encounter: Payer: Self-pay | Admitting: Nurse Practitioner

## 2021-04-01 ENCOUNTER — Other Ambulatory Visit: Payer: Self-pay

## 2021-04-01 VITALS — BP 130/88 | HR 80 | Temp 97.5°F | Ht 66.5 in | Wt 168.0 lb

## 2021-04-01 DIAGNOSIS — I10 Essential (primary) hypertension: Secondary | ICD-10-CM

## 2021-04-01 DIAGNOSIS — R200 Anesthesia of skin: Secondary | ICD-10-CM | POA: Insufficient documentation

## 2021-04-01 DIAGNOSIS — E7800 Pure hypercholesterolemia, unspecified: Secondary | ICD-10-CM

## 2021-04-01 DIAGNOSIS — M5030 Other cervical disc degeneration, unspecified cervical region: Secondary | ICD-10-CM | POA: Diagnosis not present

## 2021-04-01 DIAGNOSIS — R739 Hyperglycemia, unspecified: Secondary | ICD-10-CM

## 2021-04-01 DIAGNOSIS — M48061 Spinal stenosis, lumbar region without neurogenic claudication: Secondary | ICD-10-CM

## 2021-04-01 DIAGNOSIS — R202 Paresthesia of skin: Secondary | ICD-10-CM | POA: Insufficient documentation

## 2021-04-01 LAB — TSH: TSH: 1.78 u[IU]/mL (ref 0.35–5.50)

## 2021-04-01 LAB — LIPID PANEL
Cholesterol: 206 mg/dL — ABNORMAL HIGH (ref 0–200)
HDL: 58.2 mg/dL (ref >39.00)
LDL Cholesterol: 124 mg/dL — ABNORMAL HIGH (ref 0–99)
NonHDL: 147.35
Total CHOL/HDL Ratio: 4
Triglycerides: 118 mg/dL (ref 0.0–149.0)
VLDL: 23.6 mg/dL (ref 0.0–40.0)

## 2021-04-01 LAB — B12 AND FOLATE PANEL
Folate: >23.4 ng/mL (ref >5.9)
Vitamin B-12: 764 pg/mL (ref 211–911)

## 2021-04-01 LAB — HEMOGLOBIN A1C: Hgb A1c MFr Bld: 6.1 % (ref 4.6–6.5)

## 2021-04-01 LAB — FERRITIN: Ferritin: 146.2 ng/mL (ref 10.0–291.0)

## 2021-04-01 MED ORDER — HYDROCHLOROTHIAZIDE 25 MG PO TABS: ORAL_TABLET | ORAL | 1 refills | Status: DC

## 2021-04-01 MED ORDER — MELOXICAM 7.5 MG PO TABS: 7.5000 mg | ORAL_TABLET | Freq: Every day | ORAL | 1 refills | Status: DC

## 2021-04-01 NOTE — Progress Notes (Signed)
Subjective:  Patient ID: Audrey Wells, female    DOB: May 20, 1956  Age: 65 y.o. MRN: [id number]  CC: Establish Care (New patient/ Would like to discuss BP, pt states she has been out of BP medication x 3 days and is in need of a refill. )  HPI Transfer from [facility].  Essential hypertension Stable BP with HCTZ BP Readings from Last 3 Encounters:  04/01/21 130/88  01/23/21 (!) 147/95  01/23/21 129/77   Last BMP check 01/2021: normal renal function and electrolytes. Refill sent F/up in 33months  Degenerative disc disease, cervical Chronic, associated to radiculopathy to left arm.  no focal weakness or muscle atrophy No hx of CKD or obvious GI/GU bleed Use of meloxicam daily Refill sent  Hypercholesterolemia Unsure why statin was discontinued Repeat lipid panel today  Numbness and tingling of foot Onset 26month ago, L>R. No foot injury, no back pain, no leg weakness, no foot drop. No tobacco use No ETOH use no known hx of PAD Hx of lumbar spine stenosis diminished microfilament sensation in L. Foot Plantar surface. Normal and equal peripheral pulses Normal exam on R. leg and foot.  Check hgbA1c, TSH, B12 and folate. Consider referral to ortho-spine if normal lab results  Reviewed past Medical, Social and Family history today.  Outpatient Medications Prior to Visit  Medication Sig Dispense Refill   Multiple Vitamins-Minerals (CENTRUM SILVER 50+WOMEN PO) Take by mouth.     hydrochlorothiazide (HYDRODIURIL) 25 MG tablet TAKE 1 TABLET(25 MG) BY MOUTH DAILY 90 tablet 1   meloxicam (MOBIC) 7.5 MG tablet Take 1 tablet (7.5 mg total) by mouth daily. 90 tablet 1   rosuvastatin (CRESTOR) 10 MG tablet Take 1 tablet (10 mg total) by mouth daily. (Patient not taking: Reported on 04/01/2021) 90 tablet 3   triamcinolone (KENALOG) 0.1 % Apply 1 application topically 2 (two) times daily. (Patient not taking: Reported on 04/01/2021) 30 g 0   No facility-administered  medications prior to visit.   ROS See HPI  Objective:  BP 130/88 (BP Location: Left Arm, Patient Position: Sitting, Cuff Size: Normal)   Pulse 80   Temp (!) 97.5 F (36.4 C) (Temporal)   Ht 5' 6.5" (1.689 m)   Wt 168 lb (76.2 kg)   SpO2 98%   BMI 26.71 kg/m   Physical Exam Cardiovascular:     Rate and Rhythm: Normal rate and regular rhythm.     Pulses: Normal pulses.          Dorsalis pedis pulses are 2+ on the right side and 2+ on the left side.     Heart sounds: Normal heart sounds.  Pulmonary:     Effort: Pulmonary effort is normal.     Breath sounds: Normal breath sounds.  Musculoskeletal:     Cervical back: Normal range of motion and neck supple.     Lumbar back: Normal.     Right hip: Normal.     Left hip: Normal.     Right upper leg: Normal.     Left upper leg: Normal.     Right lower leg: Normal. No edema.     Left lower leg: Normal. No edema.     Right ankle: Normal.     Left ankle: Normal.     Right foot: Normal. Normal range of motion. No deformity, bunion, foot drop, prominent metatarsal heads or tenderness.     Left foot: Normal range of motion. No deformity, bunion, foot drop, prominent metatarsal heads or tenderness.  Feet:     Right foot:     Protective Sensation: 5 sites tested.  5 sites sensed.     Skin integrity: Skin integrity normal.     Toenail Condition: Right toenails are normal.     Left foot:     Protective Sensation: 5 sites tested.  3 sites sensed.     Skin integrity: Skin integrity normal.     Toenail Condition: Left toenails are normal.  Lymphadenopathy:     Cervical: No cervical adenopathy.  Skin:    General: Skin is warm and dry.     Findings: No rash.  Neurological:     Mental Status: She is alert and oriented to person, place, and time.  Psychiatric:        Mood and Affect: Mood normal.        Behavior: Behavior normal.        Thought Content: Thought content normal.    Assessment & Plan:  This visit occurred during the  SARS-CoV-2 public health emergency.  Safety protocols were in place, including screening questions prior to the visit, additional usage of staff PPE, and extensive cleaning of exam room while observing appropriate contact time as indicated for disinfecting solutions.   Audrey Wells was seen today for establish care.  Diagnoses and all orders for this visit:  Essential hypertension -     hydrochlorothiazide (HYDRODIURIL) 25 MG tablet; TAKE 1 TABLET(25 MG) BY MOUTH DAILY  Hypercholesterolemia -     Lipid panel  Hyperglycemia -     Hemoglobin A1c  Degenerative disc disease, cervical -     meloxicam (MOBIC) 7.5 MG tablet; Take 1 tablet (7.5 mg total) by mouth daily.  Numbness and tingling of foot -     B12 and Folate Panel -     TSH -     Ferritin   Problem List Items Addressed This Visit       Cardiovascular and Mediastinum   Essential hypertension - Primary    Stable BP with HCTZ BP Readings from Last 3 Encounters:  04/01/21 130/88  01/23/21 (!) 147/95  01/23/21 129/77   Last BMP check 01/2021: normal renal function and electrolytes. Refill sent F/up in 57months      Relevant Medications   hydrochlorothiazide (HYDRODIURIL) 25 MG tablet     Musculoskeletal and Integument   Degenerative disc disease, cervical    Chronic, associated to radiculopathy to left arm.  no focal weakness or muscle atrophy No hx of CKD or obvious GI/GU bleed Use of meloxicam daily Refill sent      Relevant Medications   meloxicam (MOBIC) 7.5 MG tablet     Other   Hypercholesterolemia    Unsure why statin was discontinued Repeat lipid panel today      Relevant Medications   hydrochlorothiazide (HYDRODIURIL) 25 MG tablet   Other Relevant Orders   Lipid panel   Numbness and tingling of foot    Onset 66month ago, L>R. No foot injury, no back pain, no leg weakness, no foot drop. No tobacco use No ETOH use no known hx of PAD Hx of lumbar spine stenosis diminished microfilament sensation  in L. Foot Plantar surface. Normal and equal peripheral pulses Normal exam on R. leg and foot.  Check hgbA1c, TSH, B12 and folate. Consider referral to ortho-spine if normal lab results      Relevant Orders   B12 and Folate Panel   TSH   Ferritin   Other Visit Diagnoses     Hyperglycemia  Relevant Orders   Hemoglobin A1c       Follow-up: Return in about 6 months (around 09/30/2021) for CPE and HTN f/up.  [name], NP

## 2021-04-01 NOTE — Assessment & Plan Note (Signed)
Onset 44month ago, L>R. No foot injury, no back pain, no leg weakness, no foot drop. No tobacco use No ETOH use no known hx of PAD Hx of lumbar spine stenosis diminished microfilament sensation in L. Foot Plantar surface. Normal and equal peripheral pulses Normal exam on R. leg and foot.  Check hgbA1c, TSH, B12 and folate. Consider referral to ortho-spine if normal lab results

## 2021-04-01 NOTE — Assessment & Plan Note (Signed)
Stable BP with HCTZ BP Readings from Last 3 Encounters:  04/01/21 130/88  01/23/21 (!) 147/95  01/23/21 129/77   Last BMP check 01/2021: normal renal function and electrolytes. Refill sent F/up in 5months

## 2021-04-01 NOTE — Assessment & Plan Note (Signed)
Unsure why statin was discontinued Repeat lipid panel today

## 2021-04-01 NOTE — Patient Instructions (Signed)
Thank you for choosing [facility]  Go to lab for blood draw.  HCTZ and meloxicam sent

## 2021-04-01 NOTE — Assessment & Plan Note (Signed)
Chronic, associated to radiculopathy to left arm.  no focal weakness or muscle atrophy No hx of CKD or obvious GI/GU bleed Use of meloxicam daily Refill sent

## 2021-04-02 NOTE — Addendum Note (Signed)
Addended by: Leana Gamer on: 04/02/2021 03:53 PM   Modules accepted: Orders

## 2021-07-03 ENCOUNTER — Ambulatory Visit (INDEPENDENT_AMBULATORY_CARE_PROVIDER_SITE_OTHER): Payer: Medicare Other

## 2021-07-03 ENCOUNTER — Other Ambulatory Visit: Payer: Self-pay

## 2021-07-03 VITALS — BP 140/68 | HR 76 | Temp 97.0°F | Ht 66.0 in | Wt 166.0 lb

## 2021-07-03 DIAGNOSIS — Z1231 Encounter for screening mammogram for malignant neoplasm of breast: Secondary | ICD-10-CM

## 2021-07-03 DIAGNOSIS — Z0000 Encounter for general adult medical examination without abnormal findings: Secondary | ICD-10-CM

## 2021-07-03 NOTE — Patient Instructions (Addendum)
Audrey Wells , Thank you for taking time to come for your Medicare Wellness Visit. I appreciate your ongoing commitment to your health goals. Please review the following plan we discussed and let me know if I can assist you in the future.   Screening recommendations/referrals: Colonoscopy: 06/23/2012 due 2024 Mammogram: ordered 07/03/2021 Bone Density: 10/11/2018 Recommended yearly ophthalmology/optometry visit for glaucoma screening and checkup Recommended yearly dental visit for hygiene and checkup  Vaccinations: Influenza vaccine: completed  Pneumococcal vaccine: completed per patient obtained at [facility]  Tdap vaccine: due  Shingles vaccine: will consider     Advanced directives: Given forms   Conditions/risks identified: none   Next appointment: 07/04/2021  1100am Audrey Wells ,NP   Preventive Care 67 Years and Older, Female Preventive care refers to lifestyle choices and visits with your health care provider that can promote health and wellness. What does preventive care include? A yearly physical exam. This is also called an annual well check. Dental exams once or twice a year. Routine eye exams. Ask your health care provider how often you should have your eyes checked. Personal lifestyle choices, including: Daily care of your teeth and gums. Regular physical activity. Eating a healthy diet. Avoiding tobacco and drug use. Limiting alcohol use. Practicing safe sex. Taking low-dose aspirin every day. Taking vitamin and mineral supplements as recommended by your health care provider. What happens during an annual well check? The services and screenings done by your health care provider during your annual well check will depend on your age, overall health, lifestyle risk factors, and family history of disease. Counseling  Your health care provider may ask you questions about your: Alcohol use. Tobacco use. Drug use. Emotional well-being. Home and relationship  well-being. Sexual activity. Eating habits. History of falls. Memory and ability to understand (cognition). Work and work Statistician. Reproductive health. Screening  You may have the following tests or measurements: Height, weight, and BMI. Blood pressure. Lipid and cholesterol levels. These may be checked every 5 years, or more frequently if you are over 84 years old. Skin check. Lung cancer screening. You may have this screening every year starting at age 57 if you have a 30-pack-year history of smoking and currently smoke or have quit within the past 15 years. Fecal occult blood test (FOBT) of the stool. You may have this test every year starting at age 108. Flexible sigmoidoscopy or colonoscopy. You may have a sigmoidoscopy every 5 years or a colonoscopy every 10 years starting at age 78. Hepatitis C blood test. Hepatitis B blood test. Sexually transmitted disease (STD) testing. Diabetes screening. This is done by checking your blood sugar (glucose) after you have not eaten for a while (fasting). You may have this done every 1-3 years. Bone density scan. This is done to screen for osteoporosis. You may have this done starting at age 25. Mammogram. This may be done every 1-2 years. Talk to your health care provider about how often you should have regular mammograms. Talk with your health care provider about your test results, treatment options, and if necessary, the need for more tests. Vaccines  Your health care provider may recommend certain vaccines, such as: Influenza vaccine. This is recommended every year. Tetanus, diphtheria, and acellular pertussis (Tdap, Td) vaccine. You may need a Td booster every 10 years. Zoster vaccine. You may need this after age 87. Pneumococcal 13-valent conjugate (PCV13) vaccine. One dose is recommended after age 27. Pneumococcal polysaccharide (PPSV23) vaccine. One dose is recommended after age 22. Talk to  your health care provider about which  screenings and vaccines you need and how often you need them. This information is not intended to replace advice given to you by your health care provider. Make sure you discuss any questions you have with your health care provider. Document Released: 07/06/2015 Document Revised: 02/27/2016 Document Reviewed: 04/10/2015 Elsevier Interactive Patient Education  2017 [employer] Prevention in the Home Falls can cause injuries. They can happen to people of all ages. There are many things you can do to make your home safe and to help prevent falls. What can I do on the outside of my home? Regularly fix the edges of walkways and driveways and fix any cracks. Remove anything that might make you trip as you walk through a door, such as a raised step or threshold. Trim any bushes or trees on the path to your home. Use bright outdoor lighting. Clear any walking paths of anything that might make someone trip, such as rocks or tools. Regularly check to see if handrails are loose or broken. Make sure that both sides of any steps have handrails. Any raised decks and porches should have guardrails on the edges. Have any leaves, snow, or ice cleared regularly. Use sand or salt on walking paths during winter. Clean up any spills in your garage right away. This includes oil or grease spills. What can I do in the bathroom? Use night lights. Install grab bars by the toilet and in the tub and shower. Do not use towel bars as grab bars. Use non-skid mats or decals in the tub or shower. If you need to sit down in the shower, use a plastic, non-slip stool. Keep the floor dry. Clean up any water that spills on the floor as soon as it happens. Remove soap buildup in the tub or shower regularly. Attach bath mats securely with double-sided non-slip rug tape. Do not have throw rugs and other things on the floor that can make you trip. What can I do in the bedroom? Use night lights. Make sure that you have a  light by your bed that is easy to reach. Do not use any sheets or blankets that are too big for your bed. They should not hang down onto the floor. Have a firm chair that has side arms. You can use this for support while you get dressed. Do not have throw rugs and other things on the floor that can make you trip. What can I do in the kitchen? Clean up any spills right away. Avoid walking on wet floors. Keep items that you use a lot in easy-to-reach places. If you need to reach something above you, use a strong step stool that has a grab bar. Keep electrical cords out of the way. Do not use floor polish or wax that makes floors slippery. If you must use wax, use non-skid floor wax. Do not have throw rugs and other things on the floor that can make you trip. What can I do with my stairs? Do not leave any items on the stairs. Make sure that there are handrails on both sides of the stairs and use them. Fix handrails that are broken or loose. Make sure that handrails are as long as the stairways. Check any carpeting to make sure that it is firmly attached to the stairs. Fix any carpet that is loose or worn. Avoid having throw rugs at the top or bottom of the stairs. If you do have throw rugs, attach  them to the floor with carpet tape. Make sure that you have a light switch at the top of the stairs and the bottom of the stairs. If you do not have them, ask someone to add them for you. What else can I do to help prevent falls? Wear shoes that: Do not have high heels. Have rubber bottoms. Are comfortable and fit you well. Are closed at the toe. Do not wear sandals. If you use a stepladder: Make sure that it is fully opened. Do not climb a closed stepladder. Make sure that both sides of the stepladder are locked into place. Ask someone to hold it for you, if possible. Clearly mark and make sure that you can see: Any grab bars or handrails. First and last steps. Where the edge of each step  is. Use tools that help you move around (mobility aids) if they are needed. These include: Canes. Walkers. Scooters. Crutches. Turn on the lights when you go into a dark area. Replace any light bulbs as soon as they burn out. Set up your furniture so you have a clear path. Avoid moving your furniture around. If any of your floors are uneven, fix them. If there are any pets around you, be aware of where they are. Review your medicines with your doctor. Some medicines can make you feel dizzy. This can increase your chance of falling. Ask your doctor what other things that you can do to help prevent falls. This information is not intended to replace advice given to you by your health care provider. Make sure you discuss any questions you have with your health care provider. Document Released: 04/05/2009 Document Revised: 11/15/2015 Document Reviewed: 07/14/2014 Elsevier Interactive Patient Education  2017 [employer].

## 2021-07-03 NOTE — Progress Notes (Addendum)
Subjective:   Audrey Wells is a 66 y.o. female who presents for an Initial Medicare Annual Wellness Visit.   I connected with Audrey Wells today by telephone and verified that I am speaking with the correct person using two identifiers. Location patient: home Location provider: work Persons participating in the virtual visit: patient, provider.   I discussed the limitations, risks, security and privacy concerns of performing an evaluation and management service by telephone and the availability of in person appointments. I also discussed with the patient that there may be a patient responsible charge related to this service. The patient expressed understanding and verbally consented to this telephonic visit.    Interactive audio and video telecommunications were attempted between this provider and patient, however failed, due to patient having technical difficulties OR patient did not have access to video capability.  We continued and completed visit with audio only.    Review of Systems     Cardiac Risk Factors include: advanced age (>49men, >25 women)     Objective:    Today's Vitals   07/03/21 1344  BP: 140/68  Pulse: 76  Temp: (!) 97 F (36.1 C)  SpO2: 99%  Weight: 166 lb (75.3 kg)  Height: 5\' 6"  (1.676 m)   Body mass index is 26.79 kg/m.  Advanced Directives 07/03/2021 01/23/2021  Does Patient Have a Medical Advance Directive? No No  Would patient like information on creating a medical advance directive? Yes (ED - Information included in AVS) No - Patient declined    Current Medications (verified) Outpatient Encounter Medications as of 07/03/2021  Medication Sig   hydrochlorothiazide (HYDRODIURIL) 25 MG tablet TAKE 1 TABLET(25 MG) BY MOUTH DAILY   meloxicam (MOBIC) 7.5 MG tablet Take 1 tablet (7.5 mg total) by mouth daily.   Multiple Vitamins-Minerals (CENTRUM SILVER 50+WOMEN PO) Take by mouth.   rosuvastatin (CRESTOR) 10 MG tablet Take 1 tablet (10 mg  total) by mouth daily. (Patient not taking: Reported on 07/03/2021)   triamcinolone (KENALOG) 0.1 % Apply 1 application topically 2 (two) times daily. (Patient not taking: Reported on 04/01/2021)   No facility-administered encounter medications on file as of 07/03/2021.    Allergies (verified) Patient has no known allergies.   History: Past Medical History:  Diagnosis Date   Hypertension    Past Surgical History:  Procedure Laterality Date   CESAREAN SECTION     TUBAL LIGATION     Family History  Problem Relation Age of Onset   Arthritis Mother    Hypertension Father    Hypertension Brother    Social History   Socioeconomic History   Marital status: Married    Spouse name: [name]   Number of children: 2   Years of education: 12   Highest education level: Not on file  Occupational History   Occupation: CNA    Employer: [employer]  Tobacco Use   Smoking status: Never   Smokeless tobacco: Never  Vaping Use   Vaping Use: Never used  Substance and Sexual Activity   Alcohol use: No    Alcohol/week: 0.0 standard drinks   Drug use: No   Sexual activity: Yes    Birth control/protection: Post-menopausal, Surgical  Other Topics Concern   Not on file  Social History Narrative   Lives with her husband and their 2 adult children.      Seat belt - 100%   Exercise - 3x/week for 1 hour   Social Determinants of Health   Financial Resource Strain:  Low Risk    Difficulty of Paying Living Expenses: Not hard at all  Food Insecurity: No Food Insecurity   Worried About Running Out of Food in the Last Year: Never true   Ran Out of Food in the Last Year: Never true  Transportation Needs: No Transportation Needs   Lack of Transportation (Medical): No   Lack of Transportation (Non-Medical): No  Physical Activity: Sufficiently Active   Days of Exercise per Week: 3 days   Minutes of Exercise per Session: 60 min  Stress: No Stress Concern Present   Feeling of Stress : Not at  all  Social Connections: Moderately Integrated   Frequency of Communication with Friends and Family: Twice a week   Frequency of Social Gatherings with Friends and Family: Twice a week   Attends Religious Services: More than 4 times per year   Active Member of [employer] or Organizations: No   Attends Music therapist: Never   Marital Status: Married    Tobacco Counseling Counseling given: Not Answered   Clinical Intake:  Pre-visit preparation completed: No  Pain : No/denies pain     Nutritional Risks: None Diabetes: No  How often do you need to have someone help you when you read instructions, pamphlets, or other written materials from your doctor or pharmacy?: 1 - Never What is the last grade level you completed in school?: high School  Diabetic?no   Interpreter Needed?: No  Information entered by :: L.Adric Wrede,LPN   Activities of Daily Living In your present state of health, do you have any difficulty performing the following activities: 07/03/2021  Hearing? N  Vision? N  Difficulty concentrating or making decisions? N  Walking or climbing stairs? N  Dressing or bathing? N  Doing errands, shopping? N  Preparing Food and eating ? N  Using the Toilet? N  In the past six months, have you accidently leaked urine? N  Do you have problems with loss of bowel control? N  Managing your Medications? N  Managing your Finances? N  Housekeeping or managing your Housekeeping? N  Some recent data might be hidden    Patient Care Team: Nche, Charlene Brooke, NP as PCP - General (Internal Medicine)  Indicate any recent Medical Services you may have received from other than [facility] providers in the past year (date may be approximate).     Assessment:   This is a routine wellness examination for [facility].  Hearing/Vision screen Vision Screening - Comments:: Annual eye exams wears glasses   Dietary issues and exercise activities discussed: Current Exercise Habits: Home  exercise routine, Type of exercise: walking, Time (Minutes): 60, Frequency (Times/Week): 3, Weekly Exercise (Minutes/Week): 180, Intensity: Mild, Exercise limited by: None identified   Goals Addressed   None    Depression Screen PHQ 2/9 Scores 07/03/2021 07/03/2021 07/19/2020 07/17/2020 06/07/2019 03/08/2019 11/23/2018  PHQ - 2 Score 0 0 0 0 0 0 0    Fall Risk Fall Risk  07/03/2021 07/19/2020 07/17/2020 06/07/2019 03/08/2019  Falls in the past year? 0 0 0 0 0  Number falls in past yr: 0 0 0 0 0  Injury with Fall? 0 0 0 0 0  Follow up Falls evaluation completed Falls evaluation completed Falls evaluation completed Falls evaluation completed Falls evaluation completed    [place]:  Any stairs in or around the home? Yes  If so, are there any without handrails? No  Home free of loose throw rugs in walkways, pet beds,  electrical cords, etc? Yes  Adequate lighting in your home to reduce risk of falls? Yes   ASSISTIVE DEVICES UTILIZED TO PREVENT FALLS:  Life alert? No  Use of a cane, walker or w/c? No  Grab bars in the bathroom? Yes  Shower chair or bench in shower? Yes  Elevated toilet seat or a handicapped toilet? Yes   TIMED UP AND GO:  Was the test performed? Yes .  Length of time to ambulate 10 feet: 10 sec.   Gait steady and fast without use of assistive device  Cognitive Function:    Normal cognitive status assessed by direct observation by this Nurse Health Advisor. No abnormalities found.      Immunizations Immunization History  Administered Date(s) Administered   Influenza,inj,Quad PF,6+ Mos 03/08/2019   PFIZER(Purple Top)SARS-COV-2 Vaccination 09/16/2019, 10/12/2019, 05/22/2020    TDAP status: Due, Education has been provided regarding the importance of this vaccine. Advised may receive this vaccine at local pharmacy or Health Dept. Aware to provide a copy of the vaccination record if obtained from local pharmacy or Health Dept.  Verbalized acceptance and understanding.  Flu Vaccine status: Up to date  Pneumococcal vaccine status: Up to date per patient obtained [facility]   Covid-19 vaccine status: Completed vaccines  Qualifies for Shingles Vaccine? Yes   Zostavax completed No   Shingrix Completed?: No.    Education has been provided regarding the importance of this vaccine. Patient has been advised to call insurance company to determine out of pocket expense if they have not yet received this vaccine. Advised may also receive vaccine at local pharmacy or Health Dept. Verbalized acceptance and understanding.  Screening Tests Health Maintenance  Topic Date Due   Pneumonia Vaccine 2+ Years old (1 - PCV) Never done   Zoster Vaccines- Shingrix (1 of 2) Never done   MAMMOGRAM  12/05/2020   COLONOSCOPY (Pts 45-32yrs Insurance coverage will need to be confirmed)  06/23/2022   TETANUS/TDAP  07/30/2026   DEXA SCAN  Completed   Hepatitis C Screening  Completed   HPV VACCINES  Aged Out   INFLUENZA VACCINE  Discontinued   COVID-19 Vaccine  Discontinued    Health Maintenance  Health Maintenance Due  Topic Date Due   Pneumonia Vaccine 6+ Years old (1 - PCV) Never done   Zoster Vaccines- Shingrix (1 of 2) Never done   MAMMOGRAM  12/05/2020    Colorectal cancer screening: Type of screening: Colonoscopy. Completed 06/23/2012. Repeat every 10 years  Mammogram status: Ordered 07/03/2021. Pt provided with contact info and advised to call to schedule appt.   Bone Density status: Completed 10/11/2018. Results reflect: Bone density results: OSTEOPENIA. Repeat every 5 years.  Lung Cancer Screening: (Low Dose CT Chest recommended if Age 67-80 years, 30 pack-year currently smoking OR have quit w/in 15years.) does not qualify.   Lung Cancer Screening Referral: n/a  Additional Screening:  Hepatitis C Screening: does not qualify; Completed 07/17/2020  Vision Screening: Recommended annual ophthalmology exams for early  detection of glaucoma and other disorders of the eye. Is the patient up to date with their annual eye exam?  Yes  Who is the provider or what is the name of the office in which the patient attends annual eye exams? [facility] If pt is not established with a provider, would they like to be referred to a provider to establish care? No .   Dental Screening: Recommended annual dental exams for proper oral hygiene  Community Resource Referral / Chronic Care Management: CRR  required this visit?  No   CCM required this visit?  No      Plan:     I have personally reviewed and noted the following in the patients chart:   Medical and social history Use of alcohol, tobacco or illicit drugs  Current medications and supplements including opioid prescriptions. Patient is not currently taking opioid prescriptions. Functional ability and status Nutritional status Physical activity Advanced directives List of other physicians Hospitalizations, surgeries, and ER visits in previous 12 months Vitals Screenings to include cognitive, depression, and falls Referrals and appointments  In addition, I have reviewed and discussed with patient certain preventive protocols, quality metrics, and best practice recommendations. A written personalized care plan for preventive services as well as general preventive health recommendations were provided to patient.     Randel Pigg, LPN   01/01/1975   Nurse Notes: none

## 2021-07-04 ENCOUNTER — Ambulatory Visit (INDEPENDENT_AMBULATORY_CARE_PROVIDER_SITE_OTHER): Payer: Medicare Other

## 2021-07-04 ENCOUNTER — Encounter: Payer: Self-pay | Admitting: Nurse Practitioner

## 2021-07-04 ENCOUNTER — Ambulatory Visit (INDEPENDENT_AMBULATORY_CARE_PROVIDER_SITE_OTHER): Payer: Medicare Other | Admitting: Nurse Practitioner

## 2021-07-04 VITALS — BP 140/84 | HR 76 | Temp 97.6°F | Ht 66.0 in | Wt 164.8 lb

## 2021-07-04 DIAGNOSIS — R202 Paresthesia of skin: Secondary | ICD-10-CM | POA: Diagnosis not present

## 2021-07-04 DIAGNOSIS — M48061 Spinal stenosis, lumbar region without neurogenic claudication: Secondary | ICD-10-CM

## 2021-07-04 DIAGNOSIS — R200 Anesthesia of skin: Secondary | ICD-10-CM

## 2021-07-04 DIAGNOSIS — E7800 Pure hypercholesterolemia, unspecified: Secondary | ICD-10-CM | POA: Diagnosis not present

## 2021-07-04 DIAGNOSIS — R7303 Prediabetes: Secondary | ICD-10-CM | POA: Insufficient documentation

## 2021-07-04 DIAGNOSIS — M47816 Spondylosis without myelopathy or radiculopathy, lumbar region: Secondary | ICD-10-CM | POA: Diagnosis not present

## 2021-07-04 DIAGNOSIS — M545 Low back pain, unspecified: Secondary | ICD-10-CM | POA: Diagnosis not present

## 2021-07-04 DIAGNOSIS — R739 Hyperglycemia, unspecified: Secondary | ICD-10-CM

## 2021-07-04 LAB — POCT GLYCOSYLATED HEMOGLOBIN (HGB A1C): Hemoglobin A1C: 6 % — AB (ref 4.0–5.6)

## 2021-07-04 NOTE — Assessment & Plan Note (Signed)
hgbA1c at 6.0% today Advised to continue heart healthy diet and regular exercise Repeat in 3-70months

## 2021-07-04 NOTE — Patient Instructions (Addendum)
Go to lab for lumbar spine x-ray. Bring BP machine to next office visit. Today's hgbA1c at 6.0% Continue regular exercise and heart healthy diet.  Preventing High Cholesterol Cholesterol is a white, waxy substance similar to fat that the human body needs to help build cells. The liver makes all the cholesterol that a person's body needs. Having high cholesterol (hypercholesterolemia) increases your risk for heart disease and stroke. Extra or excess cholesterol comes from the food that you eat. High cholesterol can often be prevented with diet and lifestyle changes. If you already have high cholesterol, you can control it with diet, lifestyle changes, and medicines. How can high cholesterol affect me? If you have high cholesterol, fatty deposits (plaques) may build up on the walls of your blood vessels. The blood vessels that carry blood away from your heart are called arteries. Plaques make the arteries narrower and stiffer. This in turn can: Restrict or block blood flow and cause blood clots to form. Increase your risk for heart attack and stroke. What can increase my risk for high cholesterol? This condition is more likely to develop in people who: Eat foods that are high in saturated fat or cholesterol. Saturated fat is mostly found in foods that come from animal sources. Are overweight. Are not getting enough exercise. Use products that contain nicotine or tobacco, such as cigarettes, e-cigarettes, and chewing tobacco. Have a family history of high cholesterol (familial hypercholesterolemia). What actions can I take to prevent this? Nutrition  Eat less saturated fat. Avoid trans fats (partially hydrogenated oils). These are often found in margarine and in some baked goods, fried foods, and snacks bought in packages. Avoid precooked or cured meat, such as bacon, sausages, or meat loaves. Avoid foods and drinks that have added sugars. Eat more fruits, vegetables, and whole grains. Choose  healthy sources of protein, such as fish, poultry, lean cuts of red meat, beans, peas, lentils, and nuts. Choose healthy sources of fat, such as: Nuts. Vegetable oils, especially olive oil. Fish that have healthy fats, such as omega-3 fatty acids. These fish include mackerel or salmon. Lifestyle Lose weight if you are overweight. Maintaining a healthy body mass index (BMI) can help prevent or control high cholesterol. It can also lower your risk for diabetes and high blood pressure. Ask your health care provider to help you with a diet and exercise plan to lose weight safely. Do not use any products that contain nicotine or tobacco. These products include cigarettes, chewing tobacco, and vaping devices, such as e-cigarettes. If you need help quitting, ask your health care provider. Alcohol use Do not drink alcohol if: Your health care provider tells you not to drink. You are pregnant, may be pregnant, or are planning to become pregnant. If you drink alcohol: Limit how much you have to: 0-1 drink a day for women. 0-2 drinks a day for men. Know how much alcohol is in your drink. In the [place] one drink equals one 12 oz bottle of beer (355 mL), one 5 oz glass of wine (148 mL), or one 1 oz glass of hard liquor (44 mL). Activity  Get enough exercise. Do exercises as told by your health care provider. Each week, do at least 150 minutes of exercise that takes a medium level of effort (moderate-intensity exercise). This kind of exercise: Makes your heart beat faster while allowing you to still be able to talk. Can be done in short sessions several times a day or longer sessions a few times a week.  For example, on 5 days each week, you could walk fast or ride your bike 3 times a day for 10 minutes each time. Medicines Your health care provider may recommend medicines to help lower cholesterol. This may be a medicine to lower the amount of cholesterol that your liver makes. You may need medicine  if: Diet and lifestyle changes have not lowered your cholesterol enough. You have high cholesterol and other risk factors for heart disease or stroke. Take over-the-counter and prescription medicines only as told by your health care provider. General information Manage your risk factors for high cholesterol. Talk with your health care provider about all your risk factors and how to lower your risk. Manage other conditions that you have, such as diabetes or high blood pressure (hypertension). Have blood tests to check your cholesterol levels at regular points in time as told by your health care provider. Keep all follow-up visits. This is important. Where to find more information American Heart Association: www.heart.org National Heart, Lung, and Blood Institute: https://wilson-eaton.com/ Summary High cholesterol increases your risk for heart disease and stroke. By keeping your cholesterol level low, you can reduce your risk for these conditions. High cholesterol can often be prevented with diet and lifestyle changes. Work with your health care provider to manage your risk factors, and have your blood tested regularly. This information is not intended to replace advice given to you by your health care provider. Make sure you discuss any questions you have with your health care provider. Document Revised: 08/13/2020 Document Reviewed: 08/13/2020 Elsevier Patient Education  2022 [employer].

## 2021-07-04 NOTE — Progress Notes (Signed)
Subjective:  Patient ID: Audrey Wells, female    DOB: 12/22/1955  Age: 66 y.o. MRN: [id number]  CC: Follow-up (Discuss hyperlipidemia and hgbA1c)  HPI  Hypercholesterolemia 60yrs ASCVD risk at 12% Declined to take any statin at this time Prefers to work of lifestyle modification. Printed information provided Repeat lipid panel in 3-27months  Hyperglycemia hgbA1c at 6.0% today Advised to continue heart healthy diet and regular exercise Repeat in 3-69months  Wt Readings from Last 3 Encounters:  07/04/21 164 lb 12.8 oz (74.8 kg)  07/03/21 166 lb (75.3 kg)  04/01/21 168 lb (76.2 kg)    BP Readings from Last 3 Encounters:  07/04/21 140/84  07/03/21 140/68  04/01/21 130/88    Reviewed past Medical, Social and Family history today.  Outpatient Medications Prior to Visit  Medication Sig Dispense Refill   hydrochlorothiazide (HYDRODIURIL) 25 MG tablet TAKE 1 TABLET(25 MG) BY MOUTH DAILY 90 tablet 1   meloxicam (MOBIC) 7.5 MG tablet Take 1 tablet (7.5 mg total) by mouth daily. 90 tablet 1   Multiple Vitamins-Minerals (CENTRUM SILVER 50+WOMEN PO) Take by mouth.     rosuvastatin (CRESTOR) 10 MG tablet Take 1 tablet (10 mg total) by mouth daily. (Patient not taking: Reported on 07/03/2021) 90 tablet 3   triamcinolone (KENALOG) 0.1 % Apply 1 application topically 2 (two) times daily. (Patient not taking: Reported on 04/01/2021) 30 g 0   No facility-administered medications prior to visit.    ROS See HPI  Objective:  BP 140/84 (BP Location: Left Arm, Patient Position: Sitting, Cuff Size: Normal)    Pulse 76    Temp 97.6 F (36.4 C) (Temporal)    Ht 5\' 6"  (1.676 m)    Wt 164 lb 12.8 oz (74.8 kg)    SpO2 100%    BMI 26.60 kg/m   Physical Exam Cardiovascular:     Rate and Rhythm: Normal rate.     Pulses: Normal pulses.  Pulmonary:     Effort: Pulmonary effort is normal.  Neurological:     Mental Status: She is alert and oriented to person, place, and time.   Assessment &  Plan:  This visit occurred during the SARS-CoV-2 public health emergency.  Safety protocols were in place, including screening questions prior to the visit, additional usage of staff PPE, and extensive cleaning of exam room while observing appropriate contact time as indicated for disinfecting solutions.   Audrey Wells was seen today for follow-up.  Diagnoses and all orders for this visit:  Hyperglycemia -     POCT glycosylated hemoglobin (Hb A1C)  Hypercholesterolemia  Numbness and tingling of foot -     DG Lumbar Spine Complete  Spinal stenosis of lumbar region, unspecified whether neurogenic claudication present -     DG Lumbar Spine Complete    Problem List Items Addressed This Visit       Other   Hypercholesterolemia    67yrs ASCVD risk at 12% Declined to take any statin at this time Prefers to work of lifestyle modification. Printed information provided Repeat lipid panel in 3-13months      Hyperglycemia - Primary    hgbA1c at 6.0% today Advised to continue heart healthy diet and regular exercise Repeat in 3-65months      Relevant Orders   POCT glycosylated hemoglobin (Hb A1C) (Completed)   Numbness and tingling of foot   Spinal stenosis of lumbar region    Follow-up: Return in about 3 months (around 10/02/2021) for HTN, hyperglycemia and , hyperlipidemia (fasting).  [name], NP

## 2021-07-04 NOTE — Assessment & Plan Note (Signed)
51yrs ASCVD risk at 12% Declined to take any statin at this time Prefers to work of lifestyle modification. Printed information provided Repeat lipid panel in 3-52months

## 2021-07-05 ENCOUNTER — Other Ambulatory Visit: Payer: Self-pay | Admitting: Nurse Practitioner

## 2021-07-05 DIAGNOSIS — I10 Essential (primary) hypertension: Secondary | ICD-10-CM

## 2021-07-05 DIAGNOSIS — M5030 Other cervical disc degeneration, unspecified cervical region: Secondary | ICD-10-CM

## 2021-07-30 ENCOUNTER — Ambulatory Visit: Payer: Medicare Other

## 2021-07-30 ENCOUNTER — Ambulatory Visit
Admission: RE | Admit: 2021-07-30 | Discharge: 2021-07-30 | Disposition: A | Discharge status: Home / Self Care | Payer: Medicare Other | Source: Ambulatory Visit | Attending: Nurse Practitioner | Admitting: Nurse Practitioner

## 2021-07-30 DIAGNOSIS — Z1231 Encounter for screening mammogram for malignant neoplasm of breast: Secondary | ICD-10-CM

## 2021-08-16 ENCOUNTER — Telehealth: Payer: Self-pay | Admitting: Nurse Practitioner

## 2021-08-16 NOTE — Telephone Encounter (Signed)
Pt is requesting her most recent lab work be mailed to address, If you could please print and mail out or give it to me and I can mail it out. Whichever works easier for you

## 2021-08-22 NOTE — Telephone Encounter (Signed)
Labs printed and sent out to mail.  ?

## 2021-10-02 ENCOUNTER — Ambulatory Visit: Payer: Medicare Other | Admitting: Nurse Practitioner

## 2021-10-11 ENCOUNTER — Encounter: Payer: Self-pay | Admitting: Nurse Practitioner

## 2021-10-11 ENCOUNTER — Ambulatory Visit (INDEPENDENT_AMBULATORY_CARE_PROVIDER_SITE_OTHER): Payer: Medicare Other | Admitting: Nurse Practitioner

## 2021-10-11 VITALS — BP 160/78 | HR 63 | Temp 97.5°F | Ht 66.0 in | Wt 164.0 lb

## 2021-10-11 DIAGNOSIS — R739 Hyperglycemia, unspecified: Secondary | ICD-10-CM | POA: Diagnosis not present

## 2021-10-11 DIAGNOSIS — I10 Essential (primary) hypertension: Secondary | ICD-10-CM

## 2021-10-11 DIAGNOSIS — E7800 Pure hypercholesterolemia, unspecified: Secondary | ICD-10-CM | POA: Diagnosis not present

## 2021-10-11 LAB — POCT GLYCOSYLATED HEMOGLOBIN (HGB A1C): Hemoglobin A1C: 5.9 % — AB (ref 4.0–5.6)

## 2021-10-11 NOTE — Assessment & Plan Note (Signed)
Repeat hgbA1c today 

## 2021-10-11 NOTE — Progress Notes (Signed)
? ?               Established Patient Visit ? ?Patient: Audrey Wells   DOB: Jun 26, 1955   66 y.o. Female  MRN: [id number] ?Visit Date: 10/11/2021 ? ?Subjective:  ?  ?Chief Complaint  ?Patient presents with  ? Follow-up  ?  Follow up HTN Hyperglycemia and Hyperlipidemia . Pt is fasting no concerns  ? ?HPI ?Essential hypertension ?Elevated today. ?Home BP readings:146/85, 135/78, 134/84, 11/65, 110/66, 141/84 ?She admits to unhealthy meal choices. ?BP Readings from Last 3 Encounters:  ?10/11/21 (!) 160/78  ?07/04/21 140/84  ?07/03/21 140/68  ? ?We discussed the importance of heart healthy diet and risk of uncontrolled BP. ?Maintain HCTZ dose ?Send BP reading via mychart in 3days ?F/up in 80month? ?Hyperglycemia ?Repeat hgbA1c today ? ?Reviewed medical, surgical, and social history today ? ?Medications: ?Outpatient Medications Prior to Visit  ?Medication Sig  ? hydrochlorothiazide (HYDRODIURIL) 25 MG tablet TAKE 1 TABLET(25 MG) BY MOUTH DAILY  ? meloxicam (MOBIC) 7.5 MG tablet TAKE 1 TABLET(7.5 MG) BY MOUTH DAILY  ? Multiple Vitamins-Minerals (CENTRUM SILVER 50+WOMEN PO) Take by mouth.  ? ?No facility-administered medications prior to visit.  ? ?Reviewed past medical and social history.  ? ?ROS per HPI above ? ? ?   ?Objective:  ?BP (!) 160/78 (BP Location: Right Arm, Patient Position: Sitting, Cuff Size: Normal)   Pulse 63   Temp (!) 97.5 ?F (36.4 ?C) (Temporal)   Ht '5\' 6"'$  (1.676 m)   Wt 164 lb (74.4 kg)   SpO2 99%   BMI 26.47 kg/m?  ? ?  ? ?Physical Exam ?Cardiovascular:  ?   Rate and Rhythm: Normal rate.  ?   Pulses: Normal pulses.  ?Pulmonary:  ?   Effort: Pulmonary effort is normal.  ?Musculoskeletal:  ?   Right lower leg: No edema.  ?   Left lower leg: No edema.  ?Neurological:  ?   Mental Status: She is alert and oriented to person, place, and time.  ?  ?No results found for any visits on 10/11/21. ?   ?Assessment & Plan:  ?  ?Problem List Items Addressed This Visit   ? ?  ? Cardiovascular and Mediastinum   ? Essential hypertension - Primary  ?  Elevated today. ?Home BP readings:146/85, 135/78, 134/84, 11/65, 110/66, 141/84 ?She admits to unhealthy meal choices. ?BP Readings from Last 3 Encounters:  ?10/11/21 (!) 160/78  ?07/04/21 140/84  ?07/03/21 140/68  ? ?We discussed the importance of heart healthy diet and risk of uncontrolled BP. ?Maintain HCTZ dose ?Send BP reading via mychart in 3days ?F/up in 365month  ?  ?  ? Other  ? Hypercholesterolemia  ? Hyperglycemia  ?  Repeat hgbA1c today ? ?  ?  ? Relevant Orders  ? POCT glycosylated hemoglobin (Hb A1C)  ? POC HgB A1c  ? ?Return in about 3 months (around 01/10/2022) for HTN, hyperlipidemia and hyperglycemia. ? ?  ? ?ChWilfred LacyNP ? ? ?

## 2021-10-11 NOTE — Patient Instructions (Addendum)
Send BP readings via mychart on Monday. ?Maintain current medications. ?hgbA1c at 5.9%: prediabetes ?Make dietary modifications as discussed. ? ?DASH Eating Plan ?DASH stands for Dietary Approaches to Stop Hypertension. The DASH eating plan is a healthy eating plan that has been shown to: ?Reduce high blood pressure (hypertension). ?Reduce your risk for type 2 diabetes, heart disease, and stroke. ?Help with weight loss. ?What are tips for following this plan? ?Reading food labels ?Check food labels for the amount of salt (sodium) per serving. Choose foods with less than 5 percent of the Daily Value of sodium. Generally, foods with less than 300 milligrams (mg) of sodium per serving fit into this eating plan. ?To find whole grains, look for the word "whole" as the first word in the ingredient list. ?Shopping ?Buy products labeled as "low-sodium" or "no salt added." ?Buy fresh foods. Avoid canned foods and pre-made or frozen meals. ?Cooking ?Avoid adding salt when cooking. Use salt-free seasonings or herbs instead of table salt or sea salt. Check with your health care provider or pharmacist before using salt substitutes. ?Do not fry foods. Cook foods using healthy methods such as baking, boiling, grilling, roasting, and broiling instead. ?Cook with heart-healthy oils, such as olive, canola, avocado, soybean, or sunflower oil. ?Meal planning ? ?Eat a balanced diet that includes: ?4 or more servings of fruits and 4 or more servings of vegetables each day. Try to fill one-half of your plate with fruits and vegetables. ?6-8 servings of whole grains each day. ?Less than 6 oz (170 g) of lean meat, poultry, or fish each day. A 3-oz (85-g) serving of meat is about the same size as a deck of cards. One egg equals 1 oz (28 g). ?2-3 servings of low-fat dairy each day. One serving is 1 cup (237 mL). ?1 serving of nuts, seeds, or beans 5 times each week. ?2-3 servings of heart-healthy fats. Healthy fats called omega-3 fatty acids  are found in foods such as walnuts, flaxseeds, fortified milks, and eggs. These fats are also found in cold-water fish, such as sardines, salmon, and mackerel. ?Limit how much you eat of: ?Canned or prepackaged foods. ?Food that is high in trans fat, such as some fried foods. ?Food that is high in saturated fat, such as fatty meat. ?Desserts and other sweets, sugary drinks, and other foods with added sugar. ?Full-fat dairy products. ?Do not salt foods before eating. ?Do not eat more than 4 egg yolks a week. ?Try to eat at least 2 vegetarian meals a week. ?Eat more home-cooked food and less restaurant, buffet, and fast food. ?Lifestyle ?When eating at a restaurant, ask that your food be prepared with less salt or no salt, if possible. ?If you drink alcohol: ?Limit how much you use to: ?0-1 drink a day for women who are not pregnant. ?0-2 drinks a day for men. ?Be aware of how much alcohol is in your drink. In the [place] one drink equals one 12 oz bottle of beer (355 mL), one 5 oz glass of wine (148 mL), or one 1? oz glass of hard liquor (44 mL). ?General information ?Avoid eating more than 2,300 mg of salt a day. If you have hypertension, you may need to reduce your sodium intake to 1,500 mg a day. ?Work with your health care provider to maintain a healthy body weight or to lose weight. Ask what an ideal weight is for you. ?Get at least 30 minutes of exercise that causes your heart to beat faster (aerobic exercise) most  days of the week. Activities may include walking, swimming, or biking. ?Work with your health care provider or dietitian to adjust your eating plan to your individual calorie needs. ?What foods should I eat? ?Fruits ?All fresh, dried, or frozen fruit. Canned fruit in natural juice (without added sugar). ?Vegetables ?Fresh or frozen vegetables (raw, steamed, roasted, or grilled). Low-sodium or reduced-sodium tomato and vegetable juice. Low-sodium or reduced-sodium tomato sauce and tomato paste.  Low-sodium or reduced-sodium canned vegetables. ?Grains ?Whole-grain or whole-wheat bread. Whole-grain or whole-wheat pasta. Brown rice. [place]. Bulgur. Whole-grain and low-sodium cereals. Pita bread. Low-fat, low-sodium crackers. Whole-wheat flour tortillas. ?Meats and other proteins ?Skinless chicken or [place]. Ground chicken or [place]. Pork with fat trimmed off. Fish and seafood. Egg whites. Dried beans, peas, or lentils. Unsalted nuts, nut butters, and seeds. Unsalted canned beans. Lean cuts of beef with fat trimmed off. Low-sodium, lean precooked or cured meat, such as sausages or meat loaves. ?Dairy ?Low-fat (1%) or fat-free (skim) milk. Reduced-fat, low-fat, or fat-free cheeses. Nonfat, low-sodium ricotta or cottage cheese. Low-fat or nonfat yogurt. Low-fat, low-sodium cheese. ?Fats and oils ?Soft margarine without trans fats. Vegetable oil. Reduced-fat, low-fat, or light mayonnaise and salad dressings (reduced-sodium). Canola, safflower, olive, avocado, soybean, and sunflower oils. Avocado. ?Seasonings and condiments ?Herbs. Spices. Seasoning mixes without salt. ?Other foods ?Unsalted popcorn and pretzels. Fat-free sweets. ?The items listed above may not be a complete list of foods and beverages you can eat. Contact a dietitian for more information. ?What foods should I avoid? ?Fruits ?Canned fruit in a light or heavy syrup. Fried fruit. Fruit in cream or butter sauce. ?Vegetables ?Creamed or fried vegetables. Vegetables in a cheese sauce. Regular canned vegetables (not low-sodium or reduced-sodium). Regular canned tomato sauce and paste (not low-sodium or reduced-sodium). Regular tomato and vegetable juice (not low-sodium or reduced-sodium). Angie Fava. Olives. ?Grains ?Baked goods made with fat, such as croissants, muffins, or some breads. Dry pasta or rice meal packs. ?Meats and other proteins ?Fatty cuts of meat. Ribs. Fried meat. Berniece Salines. Bologna, salami, and other precooked or cured meats, such as  sausages or meat loaves. Fat from the back of a pig (fatback). Bratwurst. Salted nuts and seeds. Canned beans with added salt. Canned or smoked fish. Whole eggs or egg yolks. Chicken or [place] with skin. ?Dairy ?Whole or 2% milk, cream, and half-and-half. Whole or full-fat cream cheese. Whole-fat or sweetened yogurt. Full-fat cheese. Nondairy creamers. Whipped toppings. Processed cheese and cheese spreads. ?Fats and oils ?Butter. Stick margarine. Lard. Shortening. Ghee. Bacon fat. Tropical oils, such as coconut, palm kernel, or palm oil. ?Seasonings and condiments ?Onion salt, garlic salt, seasoned salt, table salt, and sea salt. Worcestershire sauce. Tartar sauce. Barbecue sauce. Teriyaki sauce. Soy sauce, including reduced-sodium. Steak sauce. Canned and packaged gravies. Fish sauce. Oyster sauce. Cocktail sauce. Store-bought horseradish. Ketchup. Mustard. Meat flavorings and tenderizers. Bouillon cubes. Hot sauces. Pre-made or packaged marinades. Pre-made or packaged taco seasonings. Relishes. Regular salad dressings. ?Other foods ?Salted popcorn and pretzels. ?The items listed above may not be a complete list of foods and beverages you should avoid. Contact a dietitian for more information. ?Where to find more information ?National Heart, Lung, and Blood Institute: https://wilson-eaton.com/ ?American Heart Association: www.heart.org ?Academy of Nutrition and Dietetics: www.eatright.org ?[place]: www.kidney.org ?Summary ?The DASH eating plan is a healthy eating plan that has been shown to reduce high blood pressure (hypertension). It may also reduce your risk for type 2 diabetes, heart disease, and stroke. ?When on the DASH eating plan,  aim to eat more fresh fruits and vegetables, whole grains, lean proteins, low-fat dairy, and heart-healthy fats. ?With the DASH eating plan, you should limit salt (sodium) intake to 2,300 mg a day. If you have hypertension, you may need to reduce your sodium intake to  1,500 mg a day. ?Work with your health care provider or dietitian to adjust your eating plan to your individual calorie needs. ?This information is not intended to replace advice given to you by your health ca

## 2021-10-11 NOTE — Assessment & Plan Note (Signed)
Elevated today. ?Home BP readings:146/85, 135/78, 134/84, 11/65, 110/66, 141/84 ?She admits to unhealthy meal choices. ?BP Readings from Last 3 Encounters:  ?10/11/21 (!) 160/78  ?07/04/21 140/84  ?07/03/21 140/68  ? ?We discussed the importance of heart healthy diet and risk of uncontrolled BP. ?Maintain HCTZ dose ?Send BP reading via mychart in 3days ?F/up in 83month?

## 2021-10-13 ENCOUNTER — Encounter: Payer: Self-pay | Admitting: Nurse Practitioner

## 2021-10-15 ENCOUNTER — Encounter: Payer: Self-pay | Admitting: Nurse Practitioner

## 2021-10-31 NOTE — Telephone Encounter (Signed)
Please request last mammogram.  ?

## 2021-11-08 NOTE — Telephone Encounter (Signed)
Please advise 

## 2021-12-23 DIAGNOSIS — L309 Dermatitis, unspecified: Secondary | ICD-10-CM | POA: Diagnosis not present

## 2022-02-19 ENCOUNTER — Other Ambulatory Visit: Payer: Self-pay | Admitting: Nurse Practitioner

## 2022-02-19 DIAGNOSIS — I10 Essential (primary) hypertension: Secondary | ICD-10-CM

## 2022-02-19 DIAGNOSIS — M5030 Other cervical disc degeneration, unspecified cervical region: Secondary | ICD-10-CM

## 2022-02-19 NOTE — Telephone Encounter (Signed)
Chart supports Rx Last OV: 09/2021 Next OV: not scheduled, pt needs f/up appt scheduled for further refills.

## 2022-03-21 ENCOUNTER — Other Ambulatory Visit: Payer: Self-pay | Admitting: Nurse Practitioner

## 2022-03-21 DIAGNOSIS — M5030 Other cervical disc degeneration, unspecified cervical region: Secondary | ICD-10-CM

## 2022-03-21 DIAGNOSIS — I10 Essential (primary) hypertension: Secondary | ICD-10-CM

## 2022-04-26 ENCOUNTER — Other Ambulatory Visit: Payer: Self-pay | Admitting: Nurse Practitioner

## 2022-04-26 DIAGNOSIS — I10 Essential (primary) hypertension: Secondary | ICD-10-CM

## 2022-04-26 DIAGNOSIS — M5030 Other cervical disc degeneration, unspecified cervical region: Secondary | ICD-10-CM

## 2022-05-12 ENCOUNTER — Other Ambulatory Visit: Payer: Self-pay | Admitting: Nurse Practitioner

## 2022-05-12 ENCOUNTER — Telehealth: Payer: Self-pay | Admitting: Nurse Practitioner

## 2022-05-12 DIAGNOSIS — M5030 Other cervical disc degeneration, unspecified cervical region: Secondary | ICD-10-CM

## 2022-05-12 DIAGNOSIS — I10 Essential (primary) hypertension: Secondary | ICD-10-CM

## 2022-05-12 MED ORDER — HYDROCHLOROTHIAZIDE 25 MG PO TABS: 25.0000 mg | ORAL_TABLET | Freq: Every day | ORAL | 0 refills | Status: DC

## 2022-05-12 MED ORDER — MELOXICAM 7.5 MG PO TABS: ORAL_TABLET | ORAL | 0 refills | Status: DC

## 2022-05-12 NOTE — Telephone Encounter (Signed)
Caller Name: pt Call back phone #: [phone]    MEDICATION(S):  hydrochlorothiazide (HYDRODIURIL) 25 MG tablet [[id number]]  and meloxicam (MOBIC) 7.5 MG tablet [[id number]]   Has the patient contacted their pharmacy (YES/NO)? Yes contact your pharmacy. I let her know she would need an appointment before these can be filled. She scheduled one for 06/12/22.  Preferred Pharmacy:  [facility] [place], [place] - [address], [place] [address] Phone: [phone]  Fax: [phone] DEA #: [id number]

## 2022-05-12 NOTE — Telephone Encounter (Signed)
Pt needs appt for further refills. 

## 2022-05-12 NOTE — Addendum Note (Signed)
Addended by: Lucillie Garfinkel on: 05/12/2022 09:55 AM   Modules accepted: Orders

## 2022-06-06 ENCOUNTER — Other Ambulatory Visit: Payer: Self-pay | Admitting: Nurse Practitioner

## 2022-06-06 DIAGNOSIS — M5030 Other cervical disc degeneration, unspecified cervical region: Secondary | ICD-10-CM

## 2022-06-06 NOTE — Telephone Encounter (Signed)
Chart supports Rx Last OV: 09/2021 Next OV: 05/2022

## 2022-06-12 ENCOUNTER — Encounter: Payer: Self-pay | Admitting: Nurse Practitioner

## 2022-06-12 ENCOUNTER — Ambulatory Visit (INDEPENDENT_AMBULATORY_CARE_PROVIDER_SITE_OTHER): Payer: Medicare Other | Admitting: Nurse Practitioner

## 2022-06-12 VITALS — BP 144/85 | HR 81 | Temp 97.3°F | Ht 66.0 in | Wt 164.6 lb

## 2022-06-12 DIAGNOSIS — E876 Hypokalemia: Secondary | ICD-10-CM

## 2022-06-12 DIAGNOSIS — T50905A Adverse effect of unspecified drugs, medicaments and biological substances, initial encounter: Secondary | ICD-10-CM | POA: Diagnosis not present

## 2022-06-12 DIAGNOSIS — I10 Essential (primary) hypertension: Secondary | ICD-10-CM

## 2022-06-12 DIAGNOSIS — R739 Hyperglycemia, unspecified: Secondary | ICD-10-CM | POA: Diagnosis not present

## 2022-06-12 LAB — HEMOGLOBIN A1C: Hgb A1c MFr Bld: 6.1 % (ref 4.6–6.5)

## 2022-06-12 MED ORDER — HYDROCHLOROTHIAZIDE 25 MG PO TABS: 25.0000 mg | ORAL_TABLET | Freq: Every day | ORAL | 0 refills | Status: DC

## 2022-06-12 NOTE — Assessment & Plan Note (Signed)
Elevated BP today Home BP reading: 148/77, 133/80, 142/73, 135/78, 136/69, 131/72, 123/64, 113/65. BP Readings from Last 3 Encounters:  06/12/22 (!) 144/85  10/11/21 (!) 160/78  07/04/21 140/84    Repeat BMP Maintain current med dose Send BP reading via mychart in 1week. Maintain low sodium diet.

## 2022-06-12 NOTE — Progress Notes (Signed)
                Established Patient Visit  Patient: Audrey Wells   DOB: 06-08-1956   66 y.o. Female  MRN: [id number] Visit Date: 06/12/2022  Subjective:    Chief Complaint  Patient presents with   Office Visit    Med check , HTN  No concerns    HPI Essential hypertension Elevated BP today Home BP reading: 148/77, 133/80, 142/73, 135/78, 136/69, 131/72, 123/64, 113/65. BP Readings from Last 3 Encounters:  06/12/22 (!) 144/85  10/11/21 (!) 160/78  07/04/21 140/84    Repeat BMP Maintain current med dose Send BP reading via mychart in 1week. Maintain low sodium diet.  Wt Readings from Last 3 Encounters:  06/12/22 164 lb 9.6 oz (74.7 kg)  10/11/21 164 lb (74.4 kg)  07/04/21 164 lb 12.8 oz (74.8 kg)     Reviewed medical, surgical, and social history today  Medications: Outpatient Medications Prior to Visit  Medication Sig   hydrochlorothiazide (HYDRODIURIL) 25 MG tablet TAKE 1 TABLET(25 MG) BY MOUTH DAILY   meloxicam (MOBIC) 7.5 MG tablet TAKE 1 TABLET(7.5 MG) BY MOUTH DAILY   Multiple Vitamins-Minerals (CENTRUM SILVER 50+WOMEN PO) Take by mouth.   No facility-administered medications prior to visit.   Reviewed past medical and social history.   ROS per HPI above      Objective:  BP (!) 144/85   Pulse 81   Temp (!) 97.3 F (36.3 C) (Temporal)   Ht '5\' 6"'$  (1.676 m)   Wt 164 lb 9.6 oz (74.7 kg)   SpO2 96%   BMI 26.57 kg/m      Physical Exam Cardiovascular:     Rate and Rhythm: Normal rate and regular rhythm.     Pulses: Normal pulses.     Heart sounds: Normal heart sounds.  Pulmonary:     Effort: Pulmonary effort is normal.     Breath sounds: Normal breath sounds.  Musculoskeletal:     Right lower leg: No edema.     Left lower leg: No edema.  Neurological:     Mental Status: She is alert and oriented to person, place, and time.     Results for orders placed or performed in visit on 06/12/22  Hemoglobin A1c  Result Value Ref Range   Hgb A1c  MFr Bld 6.1 4.6 - 6.5 %      Assessment & Plan:    Problem List Items Addressed This Visit       Cardiovascular and Mediastinum   Essential hypertension - Primary    Elevated BP today Home BP reading: 148/77, 133/80, 142/73, 135/78, 136/69, 131/72, 123/64, 113/65. BP Readings from Last 3 Encounters:  06/12/22 (!) 144/85  10/11/21 (!) 160/78  07/04/21 140/84    Repeat BMP Maintain current med dose Send BP reading via mychart in 1week. Maintain low sodium diet.      Relevant Orders   Basic metabolic panel     Musculoskeletal and Integument   Degenerative disc disease, cervical     Other   Hyperglycemia   Relevant Orders   Hemoglobin A1c (Completed)   Return in about 3 months (around 09/11/2022) for HTN, DM, hyperlipidemia (fasting).     [name], NP

## 2022-06-12 NOTE — Assessment & Plan Note (Signed)
Repeat hgbA1c 

## 2022-06-12 NOTE — Patient Instructions (Addendum)
Go to lab Maintain current med dose Send BP reading via mychart in 1week. Maintain low sodium diet and adequate oral hydration.

## 2022-06-13 DIAGNOSIS — E7800 Pure hypercholesterolemia, unspecified: Secondary | ICD-10-CM | POA: Diagnosis not present

## 2022-06-13 DIAGNOSIS — Z1329 Encounter for screening for other suspected endocrine disorder: Secondary | ICD-10-CM | POA: Diagnosis not present

## 2022-06-13 DIAGNOSIS — Z13228 Encounter for screening for other metabolic disorders: Secondary | ICD-10-CM | POA: Diagnosis not present

## 2022-06-13 DIAGNOSIS — Z131 Encounter for screening for diabetes mellitus: Secondary | ICD-10-CM | POA: Diagnosis not present

## 2022-06-13 DIAGNOSIS — R7309 Other abnormal glucose: Secondary | ICD-10-CM | POA: Diagnosis not present

## 2022-06-13 DIAGNOSIS — M8580 Other specified disorders of bone density and structure, unspecified site: Secondary | ICD-10-CM | POA: Diagnosis not present

## 2022-06-13 DIAGNOSIS — Z130 Encounter for screening for diseases of the blood and blood-forming organs and certain disorders involving the immune mechanism: Secondary | ICD-10-CM | POA: Diagnosis not present

## 2022-06-13 DIAGNOSIS — Z1321 Encounter for screening for nutritional disorder: Secondary | ICD-10-CM | POA: Diagnosis not present

## 2022-06-13 DIAGNOSIS — Z13220 Encounter for screening for lipoid disorders: Secondary | ICD-10-CM | POA: Diagnosis not present

## 2022-06-13 LAB — BASIC METABOLIC PANEL
BUN: 11 mg/dL (ref 6–23)
CO2: 29 mEq/L (ref 19–32)
Calcium: 9.6 mg/dL (ref 8.4–10.5)
Chloride: 98 mEq/L (ref 96–112)
Creatinine, Ser: 0.73 mg/dL (ref 0.40–1.20)
GFR: 84.97 mL/min (ref >60.00)
Glucose, Bld: 91 mg/dL (ref 70–99)
Potassium: 3.3 mEq/L — ABNORMAL LOW (ref 3.5–5.1)
Sodium: 138 mEq/L (ref 135–145)

## 2022-06-13 MED ORDER — POTASSIUM CHLORIDE CRYS ER 20 MEQ PO TBCR: 20.0000 meq | EXTENDED_RELEASE_TABLET | Freq: Every day | ORAL | 0 refills | Status: DC

## 2022-06-13 NOTE — Addendum Note (Signed)
Addended by: [name] L on: 06/13/2022 10:51 PM   Modules accepted: Orders

## 2022-06-20 ENCOUNTER — Encounter: Payer: Self-pay | Admitting: Nurse Practitioner

## 2022-06-25 ENCOUNTER — Telehealth: Payer: Self-pay

## 2022-06-25 NOTE — Telephone Encounter (Signed)
LVM for pt to call the office to schedule appt for Mobile Mammo [facility]

## 2022-06-27 DIAGNOSIS — Z1231 Encounter for screening mammogram for malignant neoplasm of breast: Secondary | ICD-10-CM | POA: Diagnosis not present

## 2022-06-27 LAB — HM MAMMOGRAPHY

## 2022-07-04 ENCOUNTER — Ambulatory Visit (INDEPENDENT_AMBULATORY_CARE_PROVIDER_SITE_OTHER): Payer: Medicare Other

## 2022-07-04 VITALS — Ht 66.5 in | Wt 160.0 lb

## 2022-07-04 DIAGNOSIS — Z0000 Encounter for general adult medical examination without abnormal findings: Secondary | ICD-10-CM | POA: Diagnosis not present

## 2022-07-04 NOTE — Progress Notes (Signed)
I connected with Audrey Wells today by telephone and verified that I am speaking with the correct person using two identifiers. Location patient: home Location provider: work Persons participating in the virtual visit: Audrey Curt Marques, Glenna Durand LPN.   I discussed the limitations, risks, security and privacy concerns of performing an evaluation and management service by telephone and the availability of in person appointments. I also discussed with the patient that there may be a patient responsible charge related to this service. The patient expressed understanding and verbally consented to this telephonic visit.    Interactive audio and video telecommunications were attempted between this provider and patient, however failed, due to patient having technical difficulties OR patient did not have access to video capability.  We continued and completed visit with audio only.     Vital signs may be patient reported or missing.  Subjective:   Audrey Wells is a 67 y.o. female who presents for Medicare Annual (Subsequent) preventive examination.  Review of Systems     Cardiac Risk Factors include: advanced age (>77mn, >>10women);hypertension     Objective:    Today's Vitals   07/04/22 1056  Weight: 160 lb (72.6 kg)  Height: 5' 6.5" (1.689 m)   Body mass index is 25.44 kg/m.     07/04/2022   11:07 AM 07/03/2021    1:48 PM 01/23/2021   10:33 AM  Advanced Directives  Does Patient Have a Medical Advance Directive? No No No  Would patient like information on creating a medical advance directive?  Yes (ED - Information included in AVS) No - Patient declined    Current Medications (verified) Outpatient Encounter Medications as of 07/04/2022  Medication Sig   hydrochlorothiazide (HYDRODIURIL) 25 MG tablet Take 1 tablet (25 mg total) by mouth daily.   meloxicam (MOBIC) 7.5 MG tablet TAKE 1 TABLET(7.5 MG) BY MOUTH DAILY   Multiple Vitamins-Minerals (CENTRUM SILVER 50+WOMEN  PO) Take by mouth.   potassium chloride SA (KLOR-CON M) 20 MEQ tablet Take 1 tablet (20 mEq total) by mouth daily. (Patient not taking: Reported on 07/04/2022)   No facility-administered encounter medications on file as of 07/04/2022.    Allergies (verified) Patient has no known allergies.   History: Past Medical History:  Diagnosis Date   Hypertension    Past Surgical History:  Procedure Laterality Date   CESAREAN SECTION     TUBAL LIGATION     Family History  Problem Relation Age of Onset   Arthritis Mother    Hypertension Father    Hypertension Brother    Social History   Socioeconomic History   Marital status: Married    Spouse name: KChrissie Noa  Number of children: 2   Years of education: 12   Highest education level: Not on file  Occupational History   Occupation: CNA    Employer: [employer] Tobacco Use   Smoking status: Never   Smokeless tobacco: Never  Vaping Use   Vaping Use: Never used  Substance and Sexual Activity   Alcohol use: No    Alcohol/week: 0.0 standard drinks of alcohol   Drug use: No   Sexual activity: Yes    Birth control/protection: Post-menopausal, Surgical  Other Topics Concern   Not on file  Social History Narrative   Lives with her husband and their 2 adult children.      Seat belt - 100%   Exercise - 3x/week for 1 hour   Social Determinants of Health   Financial Resource Strain: Low  Risk  (07/04/2022)   Overall Financial Resource Strain (CARDIA)    Difficulty of Paying Living Expenses: Not hard at all  Food Insecurity: No Food Insecurity (07/04/2022)   Hunger Vital Sign    Worried About Running Out of Food in the Last Year: Never true    Ran Out of Food in the Last Year: Never true  Transportation Needs: No Transportation Needs (07/04/2022)   PRAPARE - Hydrologist (Medical): No    Lack of Transportation (Non-Medical): No  Physical Activity: Inactive (07/04/2022)   Exercise Vital Sign    Days  of Exercise per Week: 0 days    Minutes of Exercise per Session: 0 min  Stress: No Stress Concern Present (07/04/2022)   [place]    Feeling of Stress : Not at all  Social Connections: Moderately Integrated (07/03/2021)   Social Connection and Isolation Panel [NHANES]    Frequency of Communication with Friends and Family: Twice a week    Frequency of Social Gatherings with Friends and Family: Twice a week    Attends Religious Services: More than 4 times per year    Active Member of [employer] or Organizations: No    Attends Music therapist: Never    Marital Status: Married    Tobacco Counseling Counseling given: Not Answered   Clinical Intake:  Pre-visit preparation completed: Yes  Pain : No/denies pain     Nutritional Status: BMI 25 -29 Overweight Nutritional Risks: None Diabetes: No  How often do you need to have someone help you when you read instructions, pamphlets, or other written materials from your doctor or pharmacy?: 1 - Never  Diabetic? no  Interpreter Needed?: No  Information entered by :: NAllen LPN   Activities of Daily Living    07/04/2022   11:07 AM  In your present state of health, do you have any difficulty performing the following activities:  Hearing? 0  Vision? 0  Difficulty concentrating or making decisions? 0  Walking or climbing stairs? 0  Dressing or bathing? 0  Doing errands, shopping? 0  Preparing Food and eating ? N  Using the Toilet? N  In the past six months, have you accidently leaked urine? N  Do you have problems with loss of bowel control? N  Managing your Medications? N  Managing your Finances? N  Housekeeping or managing your Housekeeping? N    Patient Care Team: Nche, Charlene Brooke, NP as PCP - General (Internal Medicine)  Indicate any recent Medical Services you may have received from other than [facility] providers in the past year (date may be  approximate).     Assessment:   This is a routine wellness examination for [facility].  Hearing/Vision screen Vision Screening - Comments:: Regular eye exams, Dr. Herschel [place] at [place] issues and exercise activities discussed: Current Exercise Habits: The patient does not participate in regular exercise at present   Goals Addressed             This Visit's Progress    Patient Stated       07/04/2022, wants to stay away from sugar and high carb food       Depression Screen    07/04/2022   11:07 AM 10/11/2021    9:11 AM 07/03/2021    1:54 PM 07/03/2021    1:47 PM 07/19/2020    9:30 AM 07/17/2020   10:59 AM 06/07/2019   10:56 AM  PHQ  2/9 Scores  PHQ - 2 Score 0 0 0 0 0 0 0    Fall Risk    07/04/2022   11:07 AM 10/11/2021    9:10 AM 07/04/2021   10:40 AM 07/03/2021    1:53 PM 07/19/2020    9:30 AM  Fall Risk   Falls in the past year? 0 0 0 0 0  Number falls in past yr: 0 0 0 0 0  Injury with Fall? 0 0 0 0 0  Risk for fall due to : Medication side effect  No Fall Risks    Follow up Falls prevention discussed;Education provided;Falls evaluation completed  Falls evaluation completed Falls evaluation completed Falls evaluation completed    FALL RISK PREVENTION PERTAINING TO THE HOME:  Any stairs in or around the home? Yes  If so, are there any without handrails? No  Home free of loose throw rugs in walkways, pet beds, electrical cords, etc? Yes  Adequate lighting in your home to reduce risk of falls? Yes   ASSISTIVE DEVICES UTILIZED TO PREVENT FALLS:  Life alert? No  Use of a cane, walker or w/c? No  Grab bars in the bathroom? Yes  Shower chair or bench in shower? Yes  Elevated toilet seat or a handicapped toilet? Yes   TIMED UP AND GO:  Was the test performed? No .       Cognitive Function:        07/04/2022   11:08 AM  6CIT Screen  What Year? 0 points  What month? 0 points  What time? 0 points  Count back from 20 0 points  Months in reverse 0  points  Repeat phrase 2 points  Total Score 2 points    Immunizations Immunization History  Administered Date(s) Administered   Fluad Quad(high Dose 65+) 03/02/2022   Influenza,inj,Quad PF,6+ Mos 03/08/2019   PFIZER(Purple Top)SARS-COV-2 Vaccination 09/16/2019, 10/12/2019, 05/22/2020   Zoster Recombinat (Shingrix) 03/02/2022    TDAP status: Due, Education has been provided regarding the importance of this vaccine. Advised may receive this vaccine at local pharmacy or Health Dept. Aware to provide a copy of the vaccination record if obtained from local pharmacy or Health Dept. Verbalized acceptance and understanding.  Flu Vaccine status: Up to date  Pneumococcal vaccine status: Due, Education has been provided regarding the importance of this vaccine. Advised may receive this vaccine at local pharmacy or Health Dept. Aware to provide a copy of the vaccination record if obtained from local pharmacy or Health Dept. Verbalized acceptance and understanding.  Covid-19 vaccine status: Completed vaccines  Qualifies for Shingles Vaccine? Yes   Zostavax completed No   Shingrix Completed?: No.    Education has been provided regarding the importance of this vaccine. Patient has been advised to call insurance company to determine out of pocket expense if they have not yet received this vaccine. Advised may also receive vaccine at local pharmacy or Health Dept. Verbalized acceptance and understanding.  Screening Tests Health Maintenance  Topic Date Due   Medicare Annual Wellness (AWV)  07/03/2022   Zoster Vaccines- Shingrix (2 of 2) 09/11/2022 (Originally 04/27/2022)   Pneumonia Vaccine 35+ Years old (1 - PCV) 06/13/2023 (Originally 10/23/2019)   MAMMOGRAM  06/28/2023   COLONOSCOPY (Pts 45-18yr Insurance coverage will need to be confirmed)  06/23/2024   DEXA SCAN  Completed   Hepatitis C Screening  Completed   HPV VACCINES  Aged Out   DTaP/Tdap/Td  Discontinued   INFLUENZA VACCINE  Discontinued  COVID-19 Vaccine  Discontinued    Health Maintenance  Health Maintenance Due  Topic Date Due   Medicare Annual Wellness (AWV)  07/03/2022    Colorectal cancer screening: Type of screening: Colonoscopy. Completed 06/23/2014. Repeat every 10 years  Mammogram status: Completed 06/27/2022. Repeat every year  Bone Density status: Completed 10/11/2020.  Lung Cancer Screening: (Low Dose CT Chest recommended if Age 31-80 years, 30 pack-year currently smoking OR have quit w/in 15years.) does not qualify.   Lung Cancer Screening Referral: no  Additional Screening:  Hepatitis C Screening: does qualify; Completed 07/17/2020  Vision Screening: Recommended annual ophthalmology exams for early detection of glaucoma and other disorders of the eye. Is the patient up to date with their annual eye exam?  Yes  Who is the provider or what is the name of the office in which the patient attends annual eye exams? Dr. Herschel Senegal If pt is not established with a provider, would they like to be referred to a provider to establish care? No .   Dental Screening: Recommended annual dental exams for proper oral hygiene  Community Resource Referral / Chronic Care Management: CRR required this visit?  No   CCM required this visit?  No      Plan:     I have personally reviewed and noted the following in the patient's chart:   Medical and social history Use of alcohol, tobacco or illicit drugs  Current medications and supplements including opioid prescriptions. Patient is not currently taking opioid prescriptions. Functional ability and status Nutritional status Physical activity Advanced directives List of other physicians Hospitalizations, surgeries, and ER visits in previous 12 months Vitals Screenings to include cognitive, depression, and falls Referrals and appointments  In addition, I have reviewed and discussed with patient certain preventive protocols, quality metrics, and best practice  recommendations. A written personalized care plan for preventive services as well as general preventive health recommendations were provided to patient.     Kellie Simmering, LPN   01/31/9146   Nurse Notes: none  Due to this being a virtual visit, the after visit summary with patients personalized plan was offered to patient via mail or my-chart.  Patient would like to access on my-chart

## 2022-07-04 NOTE — Patient Instructions (Signed)
Audrey Wells , Thank you for taking time to come for your Medicare Wellness Visit. I appreciate your ongoing commitment to your health goals. Please review the following plan we discussed and let me know if I can assist you in the future.   These are the goals we discussed:  Goals      Patient Stated     07/04/2022, wants to stay away from sugar and high carb food        This is a list of the screening recommended for you and due dates:  Health Maintenance  Topic Date Due   Zoster (Shingles) Vaccine (2 of 2) 09/11/2022*   Pneumonia Vaccine (1 - PCV) 06/13/2023*   Mammogram  06/28/2023   Medicare Annual Wellness Visit  07/05/2023   Colon Cancer Screening  06/23/2024   DEXA scan (bone density measurement)  Completed   Hepatitis C Screening: USPSTF Recommendation to screen - Ages 75-79 yo.  Completed   HPV Vaccine  Aged Out   DTaP/Tdap/Td vaccine  Discontinued   Flu Shot  Discontinued   COVID-19 Vaccine  Discontinued  *Topic was postponed. The date shown is not the original due date.    Advanced directives: Advance directive discussed with you today.   Conditions/risks identified: none  Next appointment: Follow up in one year for your annual wellness visit    Preventive Care 65 Years and Older, Female Preventive care refers to lifestyle choices and visits with your health care provider that can promote health and wellness. What does preventive care include? A yearly physical exam. This is also called an annual well check. Dental exams once or twice a year. Routine eye exams. Ask your health care provider how often you should have your eyes checked. Personal lifestyle choices, including: Daily care of your teeth and gums. Regular physical activity. Eating a healthy diet. Avoiding tobacco and drug use. Limiting alcohol use. Practicing safe sex. Taking low-dose aspirin every day. Taking vitamin and mineral supplements as recommended by your health care provider. What  happens during an annual well check? The services and screenings done by your health care provider during your annual well check will depend on your age, overall health, lifestyle risk factors, and family history of disease. Counseling  Your health care provider may ask you questions about your: Alcohol use. Tobacco use. Drug use. Emotional well-being. Home and relationship well-being. Sexual activity. Eating habits. History of falls. Memory and ability to understand (cognition). Work and work Statistician. Reproductive health. Screening  You may have the following tests or measurements: Height, weight, and BMI. Blood pressure. Lipid and cholesterol levels. These may be checked every 5 years, or more frequently if you are over 56 years old. Skin check. Lung cancer screening. You may have this screening every year starting at age 49 if you have a 30-pack-year history of smoking and currently smoke or have quit within the past 15 years. Fecal occult blood test (FOBT) of the stool. You may have this test every year starting at age 68. Flexible sigmoidoscopy or colonoscopy. You may have a sigmoidoscopy every 5 years or a colonoscopy every 10 years starting at age 21. Hepatitis C blood test. Hepatitis B blood test. Sexually transmitted disease (STD) testing. Diabetes screening. This is done by checking your blood sugar (glucose) after you have not eaten for a while (fasting). You may have this done every 1-3 years. Bone density scan. This is done to screen for osteoporosis. You may have this done starting at age 53. Mammogram.  This may be done every 1-2 years. Talk to your health care provider about how often you should have regular mammograms. Talk with your health care provider about your test results, treatment options, and if necessary, the need for more tests. Vaccines  Your health care provider may recommend certain vaccines, such as: Influenza vaccine. This is recommended every  year. Tetanus, diphtheria, and acellular pertussis (Tdap, Td) vaccine. You may need a Td booster every 10 years. Zoster vaccine. You may need this after age 69. Pneumococcal 13-valent conjugate (PCV13) vaccine. One dose is recommended after age 37. Pneumococcal polysaccharide (PPSV23) vaccine. One dose is recommended after age 26. Talk to your health care provider about which screenings and vaccines you need and how often you need them. This information is not intended to replace advice given to you by your health care provider. Make sure you discuss any questions you have with your health care provider. Document Released: 07/06/2015 Document Revised: 02/27/2016 Document Reviewed: 04/10/2015 Elsevier Interactive Patient Education  2017 [place] Prevention in the Home Falls can cause injuries. They can happen to people of all ages. There are many things you can do to make your home safe and to help prevent falls. What can I do on the outside of my home? Regularly fix the edges of walkways and driveways and fix any cracks. Remove anything that might make you trip as you walk through a door, such as a raised step or threshold. Trim any bushes or trees on the path to your home. Use bright outdoor lighting. Clear any walking paths of anything that might make someone trip, such as rocks or tools. Regularly check to see if handrails are loose or broken. Make sure that both sides of any steps have handrails. Any raised decks and porches should have guardrails on the edges. Have any leaves, snow, or ice cleared regularly. Use sand or salt on walking paths during winter. Clean up any spills in your garage right away. This includes oil or grease spills. What can I do in the bathroom? Use night lights. Install grab bars by the toilet and in the tub and shower. Do not use towel bars as grab bars. Use non-skid mats or decals in the tub or shower. If you need to sit down in the shower, use a  plastic, non-slip stool. Keep the floor dry. Clean up any water that spills on the floor as soon as it happens. Remove soap buildup in the tub or shower regularly. Attach bath mats securely with double-sided non-slip rug tape. Do not have throw rugs and other things on the floor that can make you trip. What can I do in the bedroom? Use night lights. Make sure that you have a light by your bed that is easy to reach. Do not use any sheets or blankets that are too big for your bed. They should not hang down onto the floor. Have a firm chair that has side arms. You can use this for support while you get dressed. Do not have throw rugs and other things on the floor that can make you trip. What can I do in the kitchen? Clean up any spills right away. Avoid walking on wet floors. Keep items that you use a lot in easy-to-reach places. If you need to reach something above you, use a strong step stool that has a grab bar. Keep electrical cords out of the way. Do not use floor polish or wax that makes floors slippery. If you  must use wax, use non-skid floor wax. Do not have throw rugs and other things on the floor that can make you trip. What can I do with my stairs? Do not leave any items on the stairs. Make sure that there are handrails on both sides of the stairs and use them. Fix handrails that are broken or loose. Make sure that handrails are as long as the stairways. Check any carpeting to make sure that it is firmly attached to the stairs. Fix any carpet that is loose or worn. Avoid having throw rugs at the top or bottom of the stairs. If you do have throw rugs, attach them to the floor with carpet tape. Make sure that you have a light switch at the top of the stairs and the bottom of the stairs. If you do not have them, ask someone to add them for you. What else can I do to help prevent falls? Wear shoes that: Do not have high heels. Have rubber bottoms. Are comfortable and fit you  well. Are closed at the toe. Do not wear sandals. If you use a stepladder: Make sure that it is fully opened. Do not climb a closed stepladder. Make sure that both sides of the stepladder are locked into place. Ask someone to hold it for you, if possible. Clearly mark and make sure that you can see: Any grab bars or handrails. First and last steps. Where the edge of each step is. Use tools that help you move around (mobility aids) if they are needed. These include: Canes. Walkers. Scooters. Crutches. Turn on the lights when you go into a dark area. Replace any light bulbs as soon as they burn out. Set up your furniture so you have a clear path. Avoid moving your furniture around. If any of your floors are uneven, fix them. If there are any pets around you, be aware of where they are. Review your medicines with your doctor. Some medicines can make you feel dizzy. This can increase your chance of falling. Ask your doctor what other things that you can do to help prevent falls. This information is not intended to replace advice given to you by your health care provider. Make sure you discuss any questions you have with your health care provider. Document Released: 04/05/2009 Document Revised: 11/15/2015 Document Reviewed: 07/14/2014 Elsevier Interactive Patient Education  2017 [employer].

## 2022-07-06 ENCOUNTER — Other Ambulatory Visit: Payer: Self-pay | Admitting: Nurse Practitioner

## 2022-07-06 DIAGNOSIS — M5030 Other cervical disc degeneration, unspecified cervical region: Secondary | ICD-10-CM

## 2022-07-07 NOTE — Telephone Encounter (Signed)
Chart supports Rx Last OV: 05/2022 Next OV: 08/2022

## 2022-07-12 ENCOUNTER — Other Ambulatory Visit: Payer: Self-pay | Admitting: Nurse Practitioner

## 2022-07-12 DIAGNOSIS — I10 Essential (primary) hypertension: Secondary | ICD-10-CM

## 2022-07-12 DIAGNOSIS — E876 Hypokalemia: Secondary | ICD-10-CM

## 2022-08-05 ENCOUNTER — Other Ambulatory Visit: Payer: Self-pay | Admitting: Nurse Practitioner

## 2022-08-05 DIAGNOSIS — M5030 Other cervical disc degeneration, unspecified cervical region: Secondary | ICD-10-CM

## 2022-09-04 ENCOUNTER — Other Ambulatory Visit: Payer: Self-pay | Admitting: Nurse Practitioner

## 2022-09-04 DIAGNOSIS — M5030 Other cervical disc degeneration, unspecified cervical region: Secondary | ICD-10-CM

## 2022-09-12 ENCOUNTER — Encounter: Payer: Self-pay | Admitting: Nurse Practitioner

## 2022-09-12 ENCOUNTER — Ambulatory Visit: Payer: Medicare Other | Admitting: Nurse Practitioner

## 2022-09-12 ENCOUNTER — Ambulatory Visit (INDEPENDENT_AMBULATORY_CARE_PROVIDER_SITE_OTHER): Payer: Medicare Other | Admitting: Nurse Practitioner

## 2022-09-12 VITALS — BP 132/70 | HR 70 | Temp 97.9°F | Resp 16 | Ht 66.0 in | Wt 164.2 lb

## 2022-09-12 DIAGNOSIS — R739 Hyperglycemia, unspecified: Secondary | ICD-10-CM | POA: Diagnosis not present

## 2022-09-12 DIAGNOSIS — E7800 Pure hypercholesterolemia, unspecified: Secondary | ICD-10-CM

## 2022-09-12 DIAGNOSIS — I10 Essential (primary) hypertension: Secondary | ICD-10-CM

## 2022-09-12 LAB — LIPID PANEL
Cholesterol: 215 mg/dL — ABNORMAL HIGH (ref 0–200)
HDL: 65.7 mg/dL (ref >39.00)
LDL Cholesterol: 137 mg/dL — ABNORMAL HIGH (ref 0–99)
NonHDL: 149.72
Total CHOL/HDL Ratio: 3
Triglycerides: 64 mg/dL (ref 0.0–149.0)
VLDL: 12.8 mg/dL (ref 0.0–40.0)

## 2022-09-12 LAB — BASIC METABOLIC PANEL
BUN: 11 mg/dL (ref 6–23)
CO2: 30 mEq/L (ref 19–32)
Calcium: 9.5 mg/dL (ref 8.4–10.5)
Chloride: 101 mEq/L (ref 96–112)
Creatinine, Ser: 0.69 mg/dL (ref 0.40–1.20)
GFR: 89.51 mL/min (ref >60.00)
Glucose, Bld: 74 mg/dL (ref 70–99)
Potassium: 3.4 mEq/L — ABNORMAL LOW (ref 3.5–5.1)
Sodium: 141 mEq/L (ref 135–145)

## 2022-09-12 LAB — HEMOGLOBIN A1C: Hgb A1c MFr Bld: 6 % (ref 4.6–6.5)

## 2022-09-12 NOTE — Assessment & Plan Note (Signed)
Repeat hgbA1c 

## 2022-09-12 NOTE — Assessment & Plan Note (Signed)
She start omega fatty acids 1000mg  BID x 63months. Repeat lipid panel

## 2022-09-12 NOTE — Progress Notes (Signed)
                Established Patient Visit  Patient: Audrey Wells   DOB: 1955/12/13   67 y.o. Female  MRN: [id number] Visit Date: 09/12/2022  Subjective:    Chief Complaint  Patient presents with   Medical Management of Chronic Issues    HTN, DM, CHOL - Fasting- yes    HPI Essential hypertension BP at goal with HCTZ BP Readings from Last 3 Encounters:  09/12/22 132/70  06/12/22 (!) 144/85  10/11/21 (!) 160/78    Maintain med dose Repeat bmp  Hyperglycemia Repeat hgbA1c  Hypercholesterolemia She start omega fatty acids 1000mg  BID x 38months. Repeat lipid panel   Wt Readings from Last 3 Encounters:  09/12/22 164 lb 3.2 oz (74.5 kg)  07/04/22 160 lb (72.6 kg)  06/12/22 164 lb 9.6 oz (74.7 kg)    Reviewed medical, surgical, and social history today  Medications: Outpatient Medications Prior to Visit  Medication Sig   Omega-3 Fatty Acids (FISH OIL) 300 MG CAPS Take 1,000 mg by mouth 2 (two) times daily.   hydrochlorothiazide (HYDRODIURIL) 25 MG tablet Take 1 tablet (25 mg total) by mouth daily.   meloxicam (MOBIC) 7.5 MG tablet TAKE 1 TABLET(7.5 MG) BY MOUTH DAILY   Multiple Vitamins-Minerals (CENTRUM SILVER 50+WOMEN PO) Take by mouth.   No facility-administered medications prior to visit.   Reviewed past medical and social history.   ROS per HPI above      Objective:  BP 132/70   Pulse 70   Temp 97.9 F (36.6 C) (Temporal)   Resp 16   Ht 5\' 6"  (1.676 m)   Wt 164 lb 3.2 oz (74.5 kg)   SpO2 99%   BMI 26.50 kg/m      Physical Exam Vitals and nursing note reviewed.  Cardiovascular:     Rate and Rhythm: Normal rate and regular rhythm.     Pulses: Normal pulses.     Heart sounds: Normal heart sounds.  Pulmonary:     Effort: Pulmonary effort is normal.     Breath sounds: Normal breath sounds.  Musculoskeletal:     Right lower leg: No edema.     Left lower leg: No edema.  Neurological:     Mental Status: She is alert and oriented to person, place,  and time.     No results found for any visits on 09/12/22.    Assessment & Plan:    Problem List Items Addressed This Visit       Cardiovascular and Mediastinum   Essential hypertension - Primary    BP at goal with HCTZ BP Readings from Last 3 Encounters:  09/12/22 132/70  06/12/22 (!) 144/85  10/11/21 (!) 160/78    Maintain med dose Repeat bmp      Relevant Orders   Basic metabolic panel     Other   Hypercholesterolemia    She start omega fatty acids 1000mg  BID x 61months. Repeat lipid panel      Relevant Orders   Lipid panel   Hyperglycemia    Repeat hgbA1c      Relevant Orders   Hemoglobin A1c   Return in about 6 months (around 03/15/2023) for CPE (fasting).     [name], NP

## 2022-09-12 NOTE — Assessment & Plan Note (Signed)
BP at goal with HCTZ BP Readings from Last 3 Encounters:  09/12/22 132/70  06/12/22 (!) 144/85  10/11/21 (!) 160/78    Maintain med dose Repeat bmp

## 2022-09-12 NOTE — Patient Instructions (Addendum)
Go to lab Continue Heart healthy diet and daily exercise. Maintain current medications. Get 2nd shingrix vaccine from retail pharmacy

## 2022-09-15 ENCOUNTER — Other Ambulatory Visit: Payer: Self-pay | Admitting: Nurse Practitioner

## 2022-09-15 DIAGNOSIS — E876 Hypokalemia: Secondary | ICD-10-CM

## 2022-09-15 DIAGNOSIS — I10 Essential (primary) hypertension: Secondary | ICD-10-CM

## 2022-09-18 NOTE — Progress Notes (Signed)
Abnormal: Persistent low potassium due to use of HCTZ. Sent potassium prescription Normal renal function hgbA1c at 6.0%, improved from 6.1% Persistent elevated total cholesterol and LDL. You have a 12% risk of atherosclerosis which can lead to heart disease and stroke I recommend use of atorvastatin 20mg  daily. Are you ok with this prescription?

## 2022-09-19 ENCOUNTER — Encounter: Payer: Self-pay | Admitting: Nurse Practitioner

## 2022-09-21 ENCOUNTER — Encounter: Payer: Self-pay | Admitting: Nurse Practitioner

## 2022-09-21 DIAGNOSIS — E7800 Pure hypercholesterolemia, unspecified: Secondary | ICD-10-CM

## 2022-09-22 NOTE — Telephone Encounter (Signed)
Pt concerned that atorvastatin may be linked to lowering potassium that is already low. Pt is requesting call back from provider to discuss the appropriate treatment plan.  Call back [phone]

## 2022-09-23 MED ORDER — PRAVASTATIN SODIUM 40 MG PO TABS: 40.0000 mg | ORAL_TABLET | Freq: Every day | ORAL | 5 refills | Status: DC

## 2022-09-24 ENCOUNTER — Encounter: Payer: Self-pay | Admitting: Nurse Practitioner

## 2022-09-24 DIAGNOSIS — E876 Hypokalemia: Secondary | ICD-10-CM

## 2022-09-24 MED ORDER — POTASSIUM CHLORIDE CRYS ER 20 MEQ PO TBCR
20.0000 meq | EXTENDED_RELEASE_TABLET | Freq: Every day | ORAL | 1 refills | Status: DC
Start: 2022-09-24 — End: 2022-12-19

## 2022-10-07 ENCOUNTER — Other Ambulatory Visit: Payer: Self-pay | Admitting: Nurse Practitioner

## 2022-10-07 DIAGNOSIS — M5030 Other cervical disc degeneration, unspecified cervical region: Secondary | ICD-10-CM

## 2022-10-07 DIAGNOSIS — R319 Hematuria, unspecified: Secondary | ICD-10-CM | POA: Diagnosis not present

## 2022-10-07 DIAGNOSIS — R300 Dysuria: Secondary | ICD-10-CM | POA: Diagnosis not present

## 2022-10-07 DIAGNOSIS — K5900 Constipation, unspecified: Secondary | ICD-10-CM | POA: Diagnosis not present

## 2022-10-31 ENCOUNTER — Other Ambulatory Visit (INDEPENDENT_AMBULATORY_CARE_PROVIDER_SITE_OTHER): Payer: Medicare Other

## 2022-10-31 DIAGNOSIS — E7800 Pure hypercholesterolemia, unspecified: Secondary | ICD-10-CM

## 2022-10-31 LAB — LIPID PANEL
Cholesterol: 152 mg/dL (ref 0–200)
HDL: 59.3 mg/dL (ref >39.00)
LDL Cholesterol: 80 mg/dL (ref 0–99)
NonHDL: 93.05
Total CHOL/HDL Ratio: 3
Triglycerides: 63 mg/dL (ref 0.0–149.0)
VLDL: 12.6 mg/dL (ref 0.0–40.0)

## 2022-10-31 NOTE — Progress Notes (Signed)
1 gold drawn. Dm/cma

## 2022-11-03 ENCOUNTER — Encounter: Payer: Self-pay | Admitting: Nurse Practitioner

## 2022-11-03 ENCOUNTER — Other Ambulatory Visit: Payer: Self-pay | Admitting: Nurse Practitioner

## 2022-11-03 DIAGNOSIS — E7800 Pure hypercholesterolemia, unspecified: Secondary | ICD-10-CM

## 2022-11-03 MED ORDER — PRAVASTATIN SODIUM 40 MG PO TABS
40.0000 mg | ORAL_TABLET | Freq: Every day | ORAL | 3 refills | Status: DC
Start: 2022-11-03 — End: 2023-10-23

## 2022-11-06 ENCOUNTER — Other Ambulatory Visit: Payer: Self-pay | Admitting: Nurse Practitioner

## 2022-11-06 DIAGNOSIS — M5030 Other cervical disc degeneration, unspecified cervical region: Secondary | ICD-10-CM

## 2022-11-06 NOTE — Telephone Encounter (Signed)
Per patient she does not need a refill. She has quite a few left

## 2022-11-07 DIAGNOSIS — R319 Hematuria, unspecified: Secondary | ICD-10-CM | POA: Diagnosis not present

## 2022-12-19 ENCOUNTER — Ambulatory Visit (INDEPENDENT_AMBULATORY_CARE_PROVIDER_SITE_OTHER): Payer: Medicare Other | Admitting: Nurse Practitioner

## 2022-12-19 VITALS — BP 130/84 | HR 76 | Temp 97.6°F | Resp 16 | Ht 66.0 in | Wt 156.6 lb

## 2022-12-19 DIAGNOSIS — M48061 Spinal stenosis, lumbar region without neurogenic claudication: Secondary | ICD-10-CM

## 2022-12-19 DIAGNOSIS — G8929 Other chronic pain: Secondary | ICD-10-CM

## 2022-12-19 DIAGNOSIS — I10 Essential (primary) hypertension: Secondary | ICD-10-CM

## 2022-12-19 DIAGNOSIS — M545 Low back pain, unspecified: Secondary | ICD-10-CM | POA: Diagnosis not present

## 2022-12-19 DIAGNOSIS — R739 Hyperglycemia, unspecified: Secondary | ICD-10-CM

## 2022-12-19 DIAGNOSIS — E876 Hypokalemia: Secondary | ICD-10-CM | POA: Diagnosis not present

## 2022-12-19 LAB — BASIC METABOLIC PANEL
BUN: 7 mg/dL (ref 6–23)
CO2: 32 mEq/L (ref 19–32)
Calcium: 9.7 mg/dL (ref 8.4–10.5)
Chloride: 102 mEq/L (ref 96–112)
Creatinine, Ser: 0.67 mg/dL (ref 0.40–1.20)
GFR: 89.98 mL/min (ref >60.00)
Glucose, Bld: 78 mg/dL (ref 70–99)
Potassium: 3.5 mEq/L (ref 3.5–5.1)
Sodium: 141 mEq/L (ref 135–145)

## 2022-12-19 LAB — HEMOGLOBIN A1C: Hgb A1c MFr Bld: 6.2 % (ref 4.6–6.5)

## 2022-12-19 MED ORDER — POTASSIUM CHLORIDE CRYS ER 20 MEQ PO TBCR: 20.0000 meq | EXTENDED_RELEASE_TABLET | Freq: Every day | ORAL | 1 refills | Status: DC

## 2022-12-19 MED ORDER — HYDROCHLOROTHIAZIDE 25 MG PO TABS: 25.0000 mg | ORAL_TABLET | Freq: Every day | ORAL | 1 refills | Status: DC

## 2022-12-19 NOTE — Progress Notes (Signed)
Established Patient Visit  Patient: Audrey Wells   DOB: May 31, 1956   67 y.o. Female  MRN: [id number] Visit Date: 12/19/2022  Subjective:    Chief Complaint  Patient presents with   Back Pain   Chronic bilateral low back pain Acute on chronic back pain,  worse with repetitive bending, pulling and pushing during care of husband No weakness, no paresthesia, no change if GU/GI function Use of mobic with relief Hx of lumbar spinal stenosis  Normal exam today Maintain use of mobic daily and tylenol 650mg  every 8hrs prn  Essential hypertension Repeat BMP due to previous hypokalemia BP at goal Maintain med doses BP Readings from Last 3 Encounters:  12/19/22 130/84  09/12/22 132/70  06/12/22 (!) 144/85     Hyperglycemia Repeat hgbA1c  Reviewed medical, surgical, and social history today  Medications: Outpatient Medications Prior to Visit  Medication Sig   hydrochlorothiazide (HYDRODIURIL) 25 MG tablet Take 1 tablet (25 mg total) by mouth daily.   meloxicam (MOBIC) 7.5 MG tablet TAKE 1 TABLET(7.5 MG) BY MOUTH DAILY   Multiple Vitamins-Minerals (CENTRUM SILVER 50+WOMEN PO) Take by mouth.   Omega-3 Fatty Acids (FISH OIL) 300 MG CAPS Take 1,000 mg by mouth 2 (two) times daily.   potassium chloride SA (KLOR-CON M) 20 MEQ tablet Take 1 tablet (20 mEq total) by mouth daily.   pravastatin (PRAVACHOL) 40 MG tablet Take 1 tablet (40 mg total) by mouth daily.   No facility-administered medications prior to visit.   Reviewed past medical and social history.   ROS per HPI above      Objective:  BP 130/84 (BP Location: Left Arm, Patient Position: Sitting, Cuff Size: Large)   Pulse 76   Temp 97.6 F (36.4 C) (Temporal)   Resp 16   Ht 5\' 6"  (1.676 m)   Wt 156 lb 9.6 oz (71 kg)   SpO2 99%   BMI 25.28 kg/m      Physical Exam Cardiovascular:     Rate and Rhythm: Normal rate and regular rhythm.     Pulses: Normal pulses.     Heart sounds: Normal heart  sounds.  Pulmonary:     Effort: Pulmonary effort is normal.     Breath sounds: Normal breath sounds.  Abdominal:     General: There is no distension.     Palpations: Abdomen is soft.     Tenderness: There is no abdominal tenderness. There is no right CVA tenderness or left CVA tenderness.  Musculoskeletal:     Thoracic back: Normal.     Lumbar back: Normal.  Neurological:     Mental Status: She is alert and oriented to person, place, and time.     No results found for any visits on 12/19/22.    Assessment & Plan:    Problem List Items Addressed This Visit       Cardiovascular and Mediastinum   Essential hypertension - Primary    Repeat BMP due to previous hypokalemia BP at goal Maintain med doses BP Readings from Last 3 Encounters:  12/19/22 130/84  09/12/22 132/70  06/12/22 (!) 144/85         Relevant Orders   Basic metabolic panel     Other   Chronic bilateral low back pain    Acute on chronic back pain,  worse with repetitive bending, pulling and pushing during care of husband No weakness, no paresthesia, no change if  GU/GI function Use of mobic with relief Hx of lumbar spinal stenosis  Normal exam today Maintain use of mobic daily and tylenol 650mg  every 8hrs prn      Hyperglycemia    Repeat hgbA1c      Relevant Orders   Hemoglobin A1c   Spinal stenosis of lumbar region   Other Visit Diagnoses     Hypokalemia       Relevant Orders   Basic metabolic panel      Return in about 6 months (around 06/20/2023) for HTN, prediabetes, hyperlipidemia (fasting).     Alysia Penna, NP

## 2022-12-19 NOTE — Assessment & Plan Note (Signed)
Repeat BMP due to previous hypokalemia BP at goal Maintain med doses BP Readings from Last 3 Encounters:  12/19/22 130/84  09/12/22 132/70  06/12/22 (!) 144/85

## 2022-12-19 NOTE — Assessment & Plan Note (Signed)
Repeat hgbA1c 

## 2022-12-19 NOTE — Assessment & Plan Note (Signed)
Acute on chronic back pain,  worse with repetitive bending, pulling and pushing during care of husband No weakness, no paresthesia, no change if GU/GI function Use of mobic with relief Hx of lumbar spinal stenosis  Normal exam today Maintain use of mobic daily and tylenol 650mg  every 8hrs prn

## 2022-12-19 NOTE — Patient Instructions (Signed)
Go to lab Maintain mobic dose Ok to also take tylenol 650mg  every 8hrs as needed for pain

## 2023-02-13 DIAGNOSIS — R3121 Asymptomatic microscopic hematuria: Secondary | ICD-10-CM | POA: Diagnosis not present

## 2023-03-03 DIAGNOSIS — R3121 Asymptomatic microscopic hematuria: Secondary | ICD-10-CM | POA: Diagnosis not present

## 2023-03-03 DIAGNOSIS — K449 Diaphragmatic hernia without obstruction or gangrene: Secondary | ICD-10-CM | POA: Diagnosis not present

## 2023-03-03 DIAGNOSIS — R3129 Other microscopic hematuria: Secondary | ICD-10-CM | POA: Diagnosis not present

## 2023-03-08 ENCOUNTER — Encounter: Payer: Self-pay | Admitting: Nurse Practitioner

## 2023-03-11 ENCOUNTER — Encounter: Payer: Self-pay | Admitting: Nurse Practitioner

## 2023-03-11 ENCOUNTER — Ambulatory Visit (INDEPENDENT_AMBULATORY_CARE_PROVIDER_SITE_OTHER): Payer: Medicare Other | Admitting: Nurse Practitioner

## 2023-03-11 VITALS — BP 160/80 | HR 65 | Temp 98.0°F | Ht 66.0 in | Wt 153.4 lb

## 2023-03-11 DIAGNOSIS — R202 Paresthesia of skin: Secondary | ICD-10-CM

## 2023-03-11 DIAGNOSIS — E7800 Pure hypercholesterolemia, unspecified: Secondary | ICD-10-CM

## 2023-03-11 DIAGNOSIS — R200 Anesthesia of skin: Secondary | ICD-10-CM

## 2023-03-11 DIAGNOSIS — M5030 Other cervical disc degeneration, unspecified cervical region: Secondary | ICD-10-CM

## 2023-03-11 DIAGNOSIS — R7303 Prediabetes: Secondary | ICD-10-CM | POA: Diagnosis not present

## 2023-03-11 DIAGNOSIS — G8929 Other chronic pain: Secondary | ICD-10-CM | POA: Diagnosis not present

## 2023-03-11 DIAGNOSIS — M545 Low back pain, unspecified: Secondary | ICD-10-CM

## 2023-03-11 DIAGNOSIS — I10 Essential (primary) hypertension: Secondary | ICD-10-CM

## 2023-03-11 MED ORDER — MELOXICAM 7.5 MG PO TABS
ORAL_TABLET | ORAL | 0 refills | Status: DC
Start: 2023-03-11 — End: 2023-07-01

## 2023-03-11 NOTE — Progress Notes (Signed)
Established Patient Visit  Patient: Audrey Wells   DOB: 1955/11/08   67 y.o. Female  MRN: [id number] Visit Date: 03/11/2023  Subjective:    Chief Complaint  Patient presents with   Discuss lab work    Had lab work on 03/08/23 at a health fair and discuss medication, Rx refills   HPI She had labs drawn by [facility] last week. She was informed: iron level of 15 and hgbA1c of 6.1%. She is concerned ferritin level and dark red blood is due to inflammation.  Numbness and tingling of foot Chronic, worsening, constant Denies any weakness or LE swelling or claudication No tobacco or ALCOHOL use. Normal ferritin, B12, THYROID, folate. hgbA1c at 6.2%  Possibly lumbar spine radiculopathy? Entered referral to neurosurgery-spine  Hypercholesterolemia Improve LDL with pravastatin 40mg  No adverse effects with medication Maintain med dose Will repeat labs in 3months  Essential hypertension BP not at goal today. asymptomatic Not home BP check Reports she is compliant with hydrochlorothiazide and low salt diet. She stopped use of potassium supplement 6months ago due to muscle cramp. BP Readings from Last 3 Encounters:  03/11/23 (!) 160/80  12/19/22 130/84  09/12/22 132/70    With hx of hypokalemia, we discussed switching hydrochlorothiazide to maxzide, but she declined since potassium level was normal 3months ago Maintain med dose. Will repeat BMP in 3months Advised to monitor BP at home every AM and send readings via mychart in 1week.  Reviewed medical, surgical, and social history today  Medications: Outpatient Medications Prior to Visit  Medication Sig Note   hydrochlorothiazide (HYDRODIURIL) 25 MG tablet Take 1 tablet (25 mg total) by mouth daily.    Multiple Vitamins-Minerals (CENTRUM SILVER 50+WOMEN PO) Take by mouth.    pravastatin (PRAVACHOL) 40 MG tablet Take 1 tablet (40 mg total) by mouth daily.    [DISCONTINUED] meloxicam (MOBIC) 7.5 MG tablet TAKE 1  TABLET(7.5 MG) BY MOUTH DAILY    [DISCONTINUED] Omega-3 Fatty Acids (FISH OIL) 300 MG CAPS Take 1,000 mg by mouth 2 (two) times daily.    [DISCONTINUED] potassium chloride SA (KLOR-CON M) 20 MEQ tablet Take 1 tablet (20 mEq total) by mouth daily. 03/11/2023: reports muscle cramp   No facility-administered medications prior to visit.   Reviewed past medical and social history.   ROS per HPI above  Last CBC Lab Results  Component Value Date   WBC 6.3 01/23/2021   HGB 12.6 01/23/2021   HCT 38.7 01/23/2021   MCV 87.2 01/23/2021   MCH 28.4 01/23/2021   RDW 13.1 01/23/2021   PLT 259 01/23/2021   Last metabolic panel Lab Results  Component Value Date   GLUCOSE 78 12/19/2022   NA 141 12/19/2022   K 3.5 12/19/2022   CL 102 12/19/2022   CO2 32 12/19/2022   BUN 7 12/19/2022   CREATININE 0.67 12/19/2022   GFR 89.98 12/19/2022   CALCIUM 9.7 12/19/2022   PROT 7.1 07/17/2020   ALBUMIN 4.3 07/17/2020   LABGLOB 2.8 07/17/2020   AGRATIO 1.5 07/17/2020   BILITOT 0.3 07/17/2020   ALKPHOS 86 07/17/2020   AST 20 07/17/2020   ALT 11 07/17/2020   ANIONGAP 8 01/23/2021   Last hemoglobin A1c Lab Results  Component Value Date   HGBA1C 6.2 12/19/2022   Last thyroid functions Lab Results  Component Value Date   TSH 1.78 04/01/2021   Last vitamin D Lab Results  Component Value Date  VD25OH 49.6 07/17/2020   Last vitamin B12 and Folate Lab Results  Component Value Date   VITAMINB12 764 04/01/2021   FOLATE >23.4 04/01/2021        Objective:  BP (!) 160/80   Pulse 65   Temp 98 F (36.7 C)   Ht 5\' 6"  (1.676 m)   Wt 153 lb 6.4 oz (69.6 kg)   SpO2 96%   BMI 24.76 kg/m      Physical Exam Vitals and nursing note reviewed.  Cardiovascular:     Rate and Rhythm: Normal rate.     Pulses: Normal pulses.  Pulmonary:     Effort: Pulmonary effort is normal.  Musculoskeletal:     Right lower leg: No edema.     Left lower leg: No edema.  Neurological:     Mental Status: She  is alert and oriented to person, place, and time.     No results found for any visits on 03/11/23.    Assessment & Plan:    Discussed the significance of blood color, normal iron level range, and labs needed to diagnose presence of any inflammatory disorder.  With iron level of 15, this is normal and dark red blood does not indicate inflammation. She verbalized understanding.  Problem List Items Addressed This Visit     Chronic bilateral low back pain   Relevant Medications   meloxicam (MOBIC) 7.5 MG tablet   Other Relevant Orders   Ambulatory referral to Neurosurgery   Degenerative disc disease, cervical   Relevant Medications   meloxicam (MOBIC) 7.5 MG tablet   Other Relevant Orders   Ambulatory referral to Neurosurgery   Essential hypertension    BP not at goal today. asymptomatic Not home BP check Reports she is compliant with hydrochlorothiazide and low salt diet. She stopped use of potassium supplement 6months ago due to muscle cramp. BP Readings from Last 3 Encounters:  03/11/23 (!) 160/80  12/19/22 130/84  09/12/22 132/70    With hx of hypokalemia, we discussed switching hydrochlorothiazide to maxzide, but she declined since potassium level was normal 3months ago Maintain med dose. Will repeat BMP in 3months Advised to monitor BP at home every AM and send readings via mychart in 1week.       Hypercholesterolemia    Improve LDL with pravastatin 40mg  No adverse effects with medication Maintain med dose Will repeat labs in 3months      Numbness and tingling of foot    Chronic, worsening, constant Denies any weakness or LE swelling or claudication No tobacco or ALCOHOL use. Normal ferritin, B12, THYROID, folate. hgbA1c at 6.2%  Possibly lumbar spine radiculopathy? Entered referral to neurosurgery-spine      Relevant Orders   Ambulatory referral to Neurosurgery   Prediabetes - Primary   Return for Maintain upcoming appt in December.     Alysia Penna, NP

## 2023-03-11 NOTE — Assessment & Plan Note (Signed)
BP not at goal today. asymptomatic Not home BP check Reports she is compliant with hydrochlorothiazide and low salt diet. She stopped use of potassium supplement 6months ago due to muscle cramp. BP Readings from Last 3 Encounters:  03/11/23 (!) 160/80  12/19/22 130/84  09/12/22 132/70    With hx of hypokalemia, we discussed switching hydrochlorothiazide to maxzide, but she declined since potassium level was normal 3months ago Maintain med dose. Will repeat BMP in 3months Advised to monitor BP at home every AM and send readings via mychart in 1week.

## 2023-03-11 NOTE — Assessment & Plan Note (Signed)
Chronic, worsening, constant Denies any weakness or LE swelling or claudication No tobacco or ALCOHOL use. Normal ferritin, B12, THYROID, folate. hgbA1c at 6.2%  Possibly lumbar spine radiculopathy? Entered referral to neurosurgery-spine

## 2023-03-11 NOTE — Assessment & Plan Note (Signed)
Improve LDL with pravastatin 40mg  No adverse effects with medication Maintain med dose Will repeat labs in 3months

## 2023-03-11 NOTE — Patient Instructions (Signed)
Check BP every AM and record Send BP readings via mychart in 1week. Maintain current medications. Maintain upcoming appointment in December need to be fasting for this appt

## 2023-03-18 ENCOUNTER — Telehealth: Payer: Self-pay | Admitting: Nurse Practitioner

## 2023-03-18 ENCOUNTER — Telehealth: Payer: Self-pay

## 2023-03-18 NOTE — Telephone Encounter (Signed)
I returned patient's call and documented on previous phone encounter

## 2023-03-18 NOTE — Telephone Encounter (Signed)
I called patient and LVM to return call.

## 2023-03-18 NOTE — Telephone Encounter (Signed)
Pt called and stated that you called her . Please call her back

## 2023-03-18 NOTE — Telephone Encounter (Signed)
I called and spoke with patient and she will provide her BP readings on Friday.

## 2023-03-18 NOTE — Telephone Encounter (Signed)
-----   Message from [facility] sent at 03/11/2023 11:39 AM EDT ----- Regarding: Home BP readings Please ask to provide Home BP reading for last 1week.  CN

## 2023-03-20 NOTE — Telephone Encounter (Signed)
Pt contacted me and provided b/p readings listed below. She stated she will continue to check them weekly as instructed and update [place] via mychart.  M-127/72, 126/68 T- 141/71, 126/66 W-131/71, 121/66 [id number], 127/72 F-130/70, 123/71

## 2023-03-20 NOTE — Telephone Encounter (Signed)
Left patient a detailed voice message of annotation below and to return call to office to schedule an OV with PCP if she doesn't have them.

## 2023-03-23 NOTE — Telephone Encounter (Signed)
Left patient a detailed voice message with annotation below and to return call to office with any concerns. Average b/p 127/70 documented in chart.

## 2023-03-23 NOTE — Telephone Encounter (Signed)
Pt returned call, I read the message from [place] to her. She stated she understood and will follow directions.

## 2023-03-24 DIAGNOSIS — R200 Anesthesia of skin: Secondary | ICD-10-CM | POA: Diagnosis not present

## 2023-03-24 DIAGNOSIS — R202 Paresthesia of skin: Secondary | ICD-10-CM | POA: Diagnosis not present

## 2023-04-14 DIAGNOSIS — R350 Frequency of micturition: Secondary | ICD-10-CM | POA: Diagnosis not present

## 2023-04-14 DIAGNOSIS — R3121 Asymptomatic microscopic hematuria: Secondary | ICD-10-CM | POA: Diagnosis not present

## 2023-04-27 DIAGNOSIS — R202 Paresthesia of skin: Secondary | ICD-10-CM | POA: Diagnosis not present

## 2023-05-31 ENCOUNTER — Other Ambulatory Visit: Payer: Self-pay | Admitting: Nurse Practitioner

## 2023-05-31 DIAGNOSIS — I10 Essential (primary) hypertension: Secondary | ICD-10-CM

## 2023-06-01 ENCOUNTER — Telehealth: Payer: Self-pay | Admitting: Nurse Practitioner

## 2023-06-01 NOTE — Telephone Encounter (Signed)
Prescription Request  06/01/2023  LOV: 03/11/2023  What is the name of the medication or equipment? hydrochlorothiazide (HYDRODIURIL) 25 MG tablet [[id number]]   Have you contacted your pharmacy to request a refill? No   Which pharmacy would you like this sent to?  [id number] [place], [place] - [phone] AT [facility] OF [facility] & [address] [place] [address] Phone: [phone] Fax: [phone]    Patient notified that their request is being sent to the clinical staff for review and that they should receive a response within 2 business days.   Please advise at Mobile [phone] (mobile)

## 2023-06-02 NOTE — Telephone Encounter (Signed)
Spoke to pharmacy and medication is ready for pick up; patient was also called and notified; she verbalized understanding.

## 2023-06-22 ENCOUNTER — Ambulatory Visit: Payer: Medicare Other | Admitting: Nurse Practitioner

## 2023-07-01 ENCOUNTER — Other Ambulatory Visit: Payer: Self-pay | Admitting: Nurse Practitioner

## 2023-07-01 DIAGNOSIS — M545 Low back pain, unspecified: Secondary | ICD-10-CM

## 2023-07-01 DIAGNOSIS — M5030 Other cervical disc degeneration, unspecified cervical region: Secondary | ICD-10-CM

## 2023-07-22 ENCOUNTER — Encounter: Payer: Self-pay | Admitting: Nurse Practitioner

## 2023-07-22 ENCOUNTER — Ambulatory Visit: Payer: Medicare Other | Admitting: Nurse Practitioner

## 2023-07-22 VITALS — BP 160/80 | HR 74 | Temp 98.2°F | Wt 155.6 lb

## 2023-07-22 DIAGNOSIS — G8929 Other chronic pain: Secondary | ICD-10-CM

## 2023-07-22 DIAGNOSIS — R7303 Prediabetes: Secondary | ICD-10-CM

## 2023-07-22 DIAGNOSIS — M545 Low back pain, unspecified: Secondary | ICD-10-CM

## 2023-07-22 DIAGNOSIS — R202 Paresthesia of skin: Secondary | ICD-10-CM

## 2023-07-22 DIAGNOSIS — M5030 Other cervical disc degeneration, unspecified cervical region: Secondary | ICD-10-CM

## 2023-07-22 DIAGNOSIS — E7800 Pure hypercholesterolemia, unspecified: Secondary | ICD-10-CM

## 2023-07-22 DIAGNOSIS — I10 Essential (primary) hypertension: Secondary | ICD-10-CM

## 2023-07-22 LAB — LIPID PANEL
Cholesterol: 143 mg/dL (ref 0–200)
HDL: 63.6 mg/dL (ref >39.00)
LDL Cholesterol: 68 mg/dL (ref 0–99)
NonHDL: 79.54
Total CHOL/HDL Ratio: 2
Triglycerides: 56 mg/dL (ref 0.0–149.0)
VLDL: 11.2 mg/dL (ref 0.0–40.0)

## 2023-07-22 LAB — BASIC METABOLIC PANEL
BUN: 12 mg/dL (ref 6–23)
CO2: 30 meq/L (ref 19–32)
Calcium: 9.4 mg/dL (ref 8.4–10.5)
Chloride: 103 meq/L (ref 96–112)
Creatinine, Ser: 0.69 mg/dL (ref 0.40–1.20)
GFR: 88.97 mL/min (ref >60.00)
Glucose, Bld: 77 mg/dL (ref 70–99)
Potassium: 3.4 meq/L — ABNORMAL LOW (ref 3.5–5.1)
Sodium: 141 meq/L (ref 135–145)

## 2023-07-22 LAB — TSH: TSH: 0.93 u[IU]/mL (ref 0.35–5.50)

## 2023-07-22 LAB — HEMOGLOBIN A1C: Hgb A1c MFr Bld: 6.3 % (ref 4.6–6.5)

## 2023-07-22 MED ORDER — AMLODIPINE BESYLATE 5 MG PO TABS: 5.0000 mg | ORAL_TABLET | Freq: Every evening | ORAL | 1 refills | Status: DC

## 2023-07-22 MED ORDER — LISINOPRIL 5 MG PO TABS: 5.0000 mg | ORAL_TABLET | Freq: Every day | ORAL | 1 refills | Status: DC

## 2023-07-22 MED ORDER — MELOXICAM 7.5 MG PO TABS: ORAL_TABLET | ORAL | 1 refills | Status: DC

## 2023-07-22 NOTE — Assessment & Plan Note (Addendum)
Home BP: 130/72, 137/73, 13673, 138/68, 125/67, 132/69, 138/68, 139/76, 137/71, 113/65, 117/67, 136/70, 132/70, 131/70, 127/70, 116/70 Discovered 30pts discrepancy with home BP machine. Advised to get another BP machine No adverse effects with hydrochlorothiazide BP Readings from Last 3 Encounters:  07/22/23 (!) 160/80  03/23/23 127/70  03/11/23 (!) 160/80    Repeat BMP Add amlodipine 5mg  in PM Maintain hydrochlorothiazide dose. F/up in 57month

## 2023-07-22 NOTE — Progress Notes (Signed)
Established Patient Visit  Patient: Audrey Wells   DOB: 1956-06-23   68 y.o. Female  MRN: [id number] Visit Date: 07/22/2023  Subjective:    Chief Complaint  Patient presents with   Medical Management of Chronic Issues    6 months (around 06/20/2023) for HTN, prediabetes, hyperlipidemia. Fasting. Will schedule mammogram.    HPI Essential hypertension Home BP: 130/72, 137/73, 13673, 138/68, 125/67, 132/69, 138/68, 139/76, 137/71, 113/65, 117/67, 136/70, 132/70, 131/70, 127/70, 116/70 Discovered 30pts discrepancy with home BP machine. Advised to get another BP machine No adverse effects with hydrochlorothiazide BP Readings from Last 3 Encounters:  07/22/23 (!) 160/80  03/23/23 127/70  03/11/23 (!) 160/80    Repeat BMP Add amlodipine 5mg  in PM Maintain hydrochlorothiazide dose. F/up in 89month  Paresthesia of lower extremity Had appointment with neurosurgery: EMG completed, no abnormal finding. She reports symptoms resolved with diet modification-low carb and sugar  Hypercholesterolemia Improve LDL with pravastatin 40mg  No adverse effects with medication Maintain med dose  Wt Readings from Last 3 Encounters:  07/22/23 155 lb 9.6 oz (70.6 kg)  03/11/23 153 lb 6.4 oz (69.6 kg)  12/19/22 156 lb 9.6 oz (71 kg)    Reviewed medical, surgical, and social history today  Medications: Outpatient Medications Prior to Visit  Medication Sig   Multiple Vitamins-Minerals (CENTRUM SILVER 50+WOMEN PO) Take by mouth.   pravastatin (PRAVACHOL) 40 MG tablet Take 1 tablet (40 mg total) by mouth daily.   [DISCONTINUED] hydrochlorothiazide (HYDRODIURIL) 25 MG tablet TAKE 1 TABLET(25 MG) BY MOUTH DAILY   [DISCONTINUED] meloxicam (MOBIC) 7.5 MG tablet TAKE 1 TABLET(7.5 MG) BY MOUTH DAILY with food. No additional refill without office visit   No facility-administered medications prior to visit.   Reviewed past medical and social history.   ROS per HPI above       Objective:  BP (!) 160/80 Comment: Patient machine  Pulse 74   Temp 98.2 F (36.8 C) (Temporal)   Wt 155 lb 9.6 oz (70.6 kg)   SpO2 98%   BMI 25.11 kg/m      Physical Exam Vitals and nursing note reviewed.  Cardiovascular:     Rate and Rhythm: Normal rate and regular rhythm.     Pulses: Normal pulses.     Heart sounds: Normal heart sounds.  Pulmonary:     Effort: Pulmonary effort is normal.     Breath sounds: Normal breath sounds.  Musculoskeletal:     Right lower leg: No edema.     Left lower leg: No edema.  Neurological:     Mental Status: She is alert and oriented to person, place, and time.     Results for orders placed or performed in visit on 07/22/23  Hemoglobin A1c  Result Value Ref Range   Hgb A1c MFr Bld 6.3 4.6 - 6.5 %  Lipid panel  Result Value Ref Range   Cholesterol 143 0 - 200 mg/dL   Triglycerides 40.9 0.0 - 149.0 mg/dL   HDL 81.19 >14.78 mg/dL   VLDL 29.5 0.0 - 62.1 mg/dL   LDL Cholesterol 68 0 - 99 mg/dL   Total CHOL/HDL Ratio 2    NonHDL 79.54   Basic metabolic panel  Result Value Ref Range   Sodium 141 135 - 145 mEq/L   Potassium 3.4 (L) 3.5 - 5.1 mEq/L   Chloride 103 96 - 112 mEq/L   CO2 30 19 - 32 mEq/L  Glucose, Bld 77 70 - 99 mg/dL   BUN 12 6 - 23 mg/dL   Creatinine, Ser 1.30 0.40 - 1.20 mg/dL   GFR 86.57 >84.69 mL/min   Calcium 9.4 8.4 - 10.5 mg/dL  TSH  Result Value Ref Range   TSH 0.93 0.35 - 5.50 uIU/mL      Assessment & Plan:    Problem List Items Addressed This Visit     Chronic bilateral low back pain   Relevant Medications   meloxicam (MOBIC) 7.5 MG tablet   Degenerative disc disease, cervical   Relevant Medications   meloxicam (MOBIC) 7.5 MG tablet   Essential hypertension - Primary   Home BP: 130/72, 137/73, 13673, 138/68, 125/67, 132/69, 138/68, 139/76, 137/71, 113/65, 117/67, 136/70, 132/70, 131/70, 127/70, 116/70 Discovered 30pts discrepancy with home BP machine. Advised to get another BP machine No adverse  effects with hydrochlorothiazide BP Readings from Last 3 Encounters:  07/22/23 (!) 160/80  03/23/23 127/70  03/11/23 (!) 160/80    Repeat BMP Add amlodipine 5mg  in PM Maintain hydrochlorothiazide dose. F/up in 64month      Relevant Medications   amLODipine (NORVASC) 5 MG tablet   Other Relevant Orders   Basic metabolic panel (Completed)   TSH (Completed)   Hypercholesterolemia   Improve LDL with pravastatin 40mg  No adverse effects with medication Maintain med dose      Relevant Medications   amLODipine (NORVASC) 5 MG tablet   Other Relevant Orders   Lipid panel (Completed)   TSH (Completed)   Paresthesia of lower extremity   Had appointment with neurosurgery: EMG completed, no abnormal finding. She reports symptoms resolved with diet modification-low carb and sugar      Prediabetes   Relevant Orders   Hemoglobin A1c (Completed)   Return in about 4 weeks (around 08/19/2023) for HTN.     Alysia Penna, NP

## 2023-07-22 NOTE — Assessment & Plan Note (Signed)
Improve LDL with pravastatin 40mg  No adverse effects with medication Maintain med dose

## 2023-07-22 NOTE — Patient Instructions (Addendum)
Go to lab Maintain Heart healthy diet and daily exercise. Add amlodipine 5mg  in PM Continue hydrochlorothiazide 25mg  Bring BP readings to next appointment Change BP machine

## 2023-07-22 NOTE — Assessment & Plan Note (Signed)
Had appointment with neurosurgery: EMG completed, no abnormal finding. She reports symptoms resolved with diet modification-low carb and sugar

## 2023-07-24 ENCOUNTER — Encounter: Payer: Self-pay | Admitting: Nurse Practitioner

## 2023-07-27 ENCOUNTER — Ambulatory Visit (INDEPENDENT_AMBULATORY_CARE_PROVIDER_SITE_OTHER): Payer: Medicare Other

## 2023-07-27 DIAGNOSIS — Z0000 Encounter for general adult medical examination without abnormal findings: Secondary | ICD-10-CM

## 2023-07-27 NOTE — Progress Notes (Signed)
Subjective:   Audrey Wells is a 68 y.o. female who presents for Medicare Annual (Subsequent) preventive examination.  Visit Complete: Virtual I connected with  Der H Ruppert on 07/27/23 by a video and audio enabled telemedicine application and verified that I am speaking with the correct person using two identifiers.  Patient Location: Home  Provider Location: Office/Clinic  I discussed the limitations of evaluation and management by telemedicine. The patient expressed understanding and agreed to proceed.  Vital Signs: Because this visit was a virtual/telehealth visit, some criteria may be missing or patient reported. Any vitals not documented were not able to be obtained and vitals that have been documented are patient reported.  Patient Medicare AWV questionnaire was completed by the patient on 07/27/2023; I have confirmed that all information answered by patient is correct and no changes since this date.  Cardiac Risk Factors include: advanced age (>44men, >57 women);hypertension     Objective:    Today's Vitals   There is no height or weight on file to calculate BMI.     07/27/2023    4:13 PM 07/04/2022   11:07 AM 07/03/2021    1:48 PM 01/23/2021   10:33 AM  Advanced Directives  Does Patient Have a Medical Advance Directive? No No No No  Would patient like information on creating a medical advance directive? No - Patient declined  Yes (ED - Information included in AVS) No - Patient declined    Current Medications (verified) Outpatient Encounter Medications as of 07/27/2023  Medication Sig   amLODipine (NORVASC) 5 MG tablet Take 1 tablet (5 mg total) by mouth every evening.   meloxicam (MOBIC) 7.5 MG tablet TAKE 1 TABLET(7.5 MG) BY MOUTH DAILY with food.   Multiple Vitamins-Minerals (CENTRUM SILVER 50+WOMEN PO) Take by mouth.   pravastatin (PRAVACHOL) 40 MG tablet Take 1 tablet (40 mg total) by mouth daily.   No facility-administered encounter medications on file as  of 07/27/2023.    Allergies (verified) Lisinopril   History: Past Medical History:  Diagnosis Date   Hypertension    Past Surgical History:  Procedure Laterality Date   CESAREAN SECTION     TUBAL LIGATION     Family History  Problem Relation Age of Onset   Arthritis Mother    Hypertension Father    Hypertension Brother    Social History   Socioeconomic History   Marital status: Widowed    Spouse name: Iantha Fallen   Number of children: 2   Years of education: 12   Highest education level: Some college, no degree  Occupational History   Occupation: Scientist, research (medical): Advice worker  Tobacco Use   Smoking status: Never   Smokeless tobacco: Never  Vaping Use   Vaping status: Never Used  Substance and Sexual Activity   Alcohol use: No    Alcohol/week: 0.0 standard drinks of alcohol   Drug use: No   Sexual activity: Yes    Birth control/protection: Post-menopausal, Surgical  Other Topics Concern   Not on file  Social History Narrative   Lives with her husband and their 2 adult children.      Seat belt - 100%   Exercise - 3x/week for 1 hour   Social Drivers of Health   Financial Resource Strain: Low Risk  (07/27/2023)   Overall Financial Resource Strain (CARDIA)    Difficulty of Paying Living Expenses: Not hard at all  Food Insecurity: No Food Insecurity (07/27/2023)   Hunger Vital Sign  Worried About Programme researcher, broadcasting/film/video in the Last Year: Never true    Ran Out of Food in the Last Year: Never true  Transportation Needs: No Transportation Needs (07/27/2023)   PRAPARE - Administrator, Civil Service (Medical): No    Lack of Transportation (Non-Medical): No  Physical Activity: Sufficiently Active (07/27/2023)   Exercise Vital Sign    Days of Exercise per Week: 5 days    Minutes of Exercise per Session: 90 min  Stress: No Stress Concern Present (07/27/2023)   [employer] of Occupational Health - Occupational Stress Questionnaire    Feeling of Stress : Not  at all  Social Connections: Moderately Isolated (07/27/2023)   Social Connection and Isolation Panel [NHANES]    Frequency of Communication with Friends and Family: More than three times a week    Frequency of Social Gatherings with Friends and Family: Three times a week    Attends Religious Services: More than 4 times per year    Active Member of Clubs or Organizations: No    Attends Banker Meetings: Never    Marital Status: Widowed    Tobacco Counseling Counseling given: Not Answered   Clinical Intake:  Pre-visit preparation completed: Yes  Pain : No/denies pain     Nutritional Risks: None Diabetes: No  How often do you need to have someone help you when you read instructions, pamphlets, or other written materials from your doctor or pharmacy?: 1 - Never  Interpreter Needed?: No  Information entered by :: NAllen LPN   Activities of Daily Living    07/27/2023    3:25 PM  In your present state of health, do you have any difficulty performing the following activities:  Hearing? 0  Vision? 0  Difficulty concentrating or making decisions? 0  Walking or climbing stairs? 0  Dressing or bathing? 0  Doing errands, shopping? 0  Preparing Food and eating ? N  Using the Toilet? N  In the past six months, have you accidently leaked urine? N  Do you have problems with loss of bowel control? N  Managing your Medications? N  Managing your Finances? N  Housekeeping or managing your Housekeeping? N    Patient Care Team: Nche, Bonna Gains, NP as PCP - General (Internal Medicine)  Indicate any recent Medical Services you may have received from other than [facility] providers in the past year (date may be approximate).     Assessment:   This is a routine wellness examination for [facility].  Hearing/Vision screen Hearing Screening - Comments:: Denies hearing issues Vision Screening - Comments:: Regular eye exams,    Goals Addressed             This Visit's  Progress    Patient Stated       07/27/2023, wants to BP under control       Depression Screen    07/27/2023    4:15 PM 07/22/2023    9:50 AM 07/04/2022   11:07 AM 10/11/2021    9:11 AM 07/03/2021    1:54 PM 07/03/2021    1:47 PM 07/19/2020    9:30 AM  PHQ 2/9 Scores  PHQ - 2 Score 0 0 0 0 0 0 0  PHQ- 9 Score  0         Fall Risk    07/27/2023    3:25 PM 09/12/2022    1:14 PM 07/04/2022   11:07 AM 10/11/2021    9:10 AM 07/04/2021  10:40 AM  Fall Risk   Falls in the past year? 0 0 0 0 0  Number falls in past yr: 0 0 0 0 0  Injury with Fall? 0 0 0 0 0  Risk for fall due to : Medication side effect No Fall Risks Medication side effect  No Fall Risks  Follow up Falls prevention discussed;Falls evaluation completed Falls evaluation completed Falls prevention discussed;Education provided;Falls evaluation completed  Falls evaluation completed    MEDICARE RISK AT HOME: Medicare Risk at Home Any stairs in or around the home?: (Patient-Rptd) Yes If so, are there any without handrails?: No Home free of loose throw rugs in walkways, pet beds, electrical cords, etc?: (Patient-Rptd) Yes Adequate lighting in your home to reduce risk of falls?: (Patient-Rptd) Yes Life alert?: (Patient-Rptd) No Use of a cane, walker or w/c?: (Patient-Rptd) No Grab bars in the bathroom?: (Patient-Rptd) Yes Shower chair or bench in shower?: (Patient-Rptd) Yes Elevated toilet seat or a handicapped toilet?: (Patient-Rptd) No  TIMED UP AND GO:  Was the test performed?  No    Cognitive Function:        07/27/2023    4:15 PM 07/04/2022   11:08 AM  6CIT Screen  What Year? 0 points 0 points  What month? 0 points 0 points  What time? 0 points 0 points  Count back from 20 4 points 0 points  Months in reverse 0 points 0 points  Repeat phrase 0 points 2 points  Total Score 4 points 2 points    Immunizations Immunization History  Administered Date(s) Administered   Fluad Quad(high Dose 65+) 03/02/2022,  04/17/2023   Influenza,inj,Quad PF,6+ Mos 03/08/2019   PFIZER(Purple Top)SARS-COV-2 Vaccination 09/16/2019, 10/12/2019, 05/22/2020   Zoster Recombinant(Shingrix) 03/02/2022, 12/15/2022    TDAP status: Due, Education has been provided regarding the importance of this vaccine. Advised may receive this vaccine at local pharmacy or Health Dept. Aware to provide a copy of the vaccination record if obtained from local pharmacy or Health Dept. Verbalized acceptance and understanding.  Flu Vaccine status: Up to date  Pneumococcal vaccine status: Due, Education has been provided regarding the importance of this vaccine. Advised may receive this vaccine at local pharmacy or Health Dept. Aware to provide a copy of the vaccination record if obtained from local pharmacy or Health Dept. Verbalized acceptance and understanding.  Covid-19 vaccine status: Information provided on how to obtain vaccines.   Qualifies for Shingles Vaccine? Yes   Zostavax completed Yes   Shingrix Completed?: Yes  Screening Tests Health Maintenance  Topic Date Due   MAMMOGRAM  06/28/2023   Pneumonia Vaccine 28+ Years old (1 of 1 - PCV) 07/21/2024 (Originally 10/23/2019)   Colonoscopy  06/23/2024   Medicare Annual Wellness (AWV)  07/26/2024   DEXA SCAN  Completed   Hepatitis C Screening  Completed   Zoster Vaccines- Shingrix  Completed   HPV VACCINES  Aged Out   DTaP/Tdap/Td  Discontinued   INFLUENZA VACCINE  Discontinued   COVID-19 Vaccine  Discontinued    Health Maintenance  Health Maintenance Due  Topic Date Due   MAMMOGRAM  06/28/2023    Colorectal cancer screening: Type of screening: Colonoscopy. Completed 06/23/2014. Repeat every 10 years  Mammogram status: scheduled for 08/21/2023  Bone Density status: Completed 10/11/2018.   Lung Cancer Screening: (Low Dose CT Chest recommended if Age 10-80 years, 20 pack-year currently smoking OR have quit w/in 15years.) does not qualify.   Lung Cancer Screening Referral:  no  Additional Screening:  Hepatitis  C Screening: does qualify; Completed 07/17/2020  Vision Screening: Recommended annual ophthalmology exams for early detection of glaucoma and other disorders of the eye. Is the patient up to date with their annual eye exam?  No  Who is the provider or what is the name of the office in which the patient attends annual eye exams? Looking for new eye doctor If pt is not established with a provider, would they like to be referred to a provider to establish care? No .   Dental Screening: Recommended annual dental exams for proper oral hygiene  Diabetic Foot Exam: n/a  Community Resource Referral / Chronic Care Management: CRR required this visit?  No   CCM required this visit?  No     Plan:     I have personally reviewed and noted the following in the patient's chart:   Medical and social history Use of alcohol, tobacco or illicit drugs  Current medications and supplements including opioid prescriptions. Patient is not currently taking opioid prescriptions. Functional ability and status Nutritional status Physical activity Advanced directives List of other physicians Hospitalizations, surgeries, and ER visits in previous 12 months Vitals Screenings to include cognitive, depression, and falls Referrals and appointments  In addition, I have reviewed and discussed with patient certain preventive protocols, quality metrics, and best practice recommendations. A written personalized care plan for preventive services as well as general preventive health recommendations were provided to patient.     Barb Merino, LPN   07/27/4008   After Visit Summary: (MyChart) Due to this being a telephonic visit, the after visit summary with patients personalized plan was offered to patient via MyChart   Nurse Notes: none

## 2023-07-27 NOTE — Patient Instructions (Signed)
[name] , Thank you for taking time to come for your Medicare Wellness Visit. I appreciate your ongoing commitment to your health goals. Please review the following plan we discussed and let me know if I can assist you in the future.   Referrals/Orders/Follow-Ups/Clinician Recommendations: none  This is a list of the screening recommended for you and due dates:  Health Maintenance  Topic Date Due   Mammogram  06/28/2023   Pneumonia Vaccine (1 of 1 - PCV) 07/21/2024*   Colon Cancer Screening  06/23/2024   Medicare Annual Wellness Visit  07/26/2024   DEXA scan (bone density measurement)  Completed   Hepatitis C Screening  Completed   Zoster (Shingles) Vaccine  Completed   HPV Vaccine  Aged Out   DTaP/Tdap/Td vaccine  Discontinued   Flu Shot  Discontinued   COVID-19 Vaccine  Discontinued  *Topic was postponed. The date shown is not the original due date.    Advanced directives: (Declined) Advance directive discussed with you today. Even though you declined this today, please call our office should you change your mind, and we can give you the proper paperwork for you to fill out.  Next Medicare Annual Wellness Visit scheduled for next year: Yes  insert Preventive Care attachment Insert FALL PREVENTION attachment if needed

## 2023-08-19 ENCOUNTER — Ambulatory Visit: Payer: Medicare Other | Admitting: Nurse Practitioner

## 2023-08-19 ENCOUNTER — Other Ambulatory Visit: Payer: Self-pay | Admitting: Nurse Practitioner

## 2023-08-19 DIAGNOSIS — M545 Low back pain, unspecified: Secondary | ICD-10-CM

## 2023-08-19 DIAGNOSIS — M5030 Other cervical disc degeneration, unspecified cervical region: Secondary | ICD-10-CM

## 2023-08-19 NOTE — Telephone Encounter (Signed)
 Copied from CRM [id number]. Topic: Clinical - Medication Refill >> Aug 19, 2023 12:15 PM Alcus Dad wrote: Most Recent Primary Care Visit:  Provider: Barb Merino  Department: [facility]  Visit Type: MEDICARE AWV, SEQUENTIAL  Date: 07/27/2023  Medication: meloxicam (MOBIC) 7.5 MG tablet  Has the patient contacted their pharmacy? No (Agent: If no, request that the patient contact the pharmacy for the refill. If patient does not wish to contact the pharmacy document the reason why and proceed with request.) (Agent: If yes, when and what did the pharmacy advise?)  Is this the correct pharmacy for this prescription? Yes If no, delete pharmacy and type the correct one.  This is the patient's preferred pharmacy:  [id number] [place], [place] - [phone] AT [facility] OF [facility] & [address] [place] [address] Phone: [phone] Fax: [phone]   Has the prescription been filled recently? No  Is the patient out of the medication? Yes  Has the patient been seen for an appointment in the last year OR does the patient have an upcoming appointment? Yes  Can we respond through MyChart? Yes  Agent: Please be advised that Rx refills may take up to 3 business days. We ask that you follow-up with your pharmacy.

## 2023-08-26 ENCOUNTER — Other Ambulatory Visit: Payer: Self-pay | Admitting: Nurse Practitioner

## 2023-08-26 DIAGNOSIS — I10 Essential (primary) hypertension: Secondary | ICD-10-CM

## 2023-08-31 IMAGING — DX DG LUMBAR SPINE COMPLETE 4+V
5 series · 5 of 5 positions shown · non-contrast
Comparison: Lumbar spine radiograph dated 06/23/2006.

CLINICAL DATA: Back pain.

EXAM:
LUMBAR SPINE - COMPLETE 4+ VIEW

[lumbar spine ap]
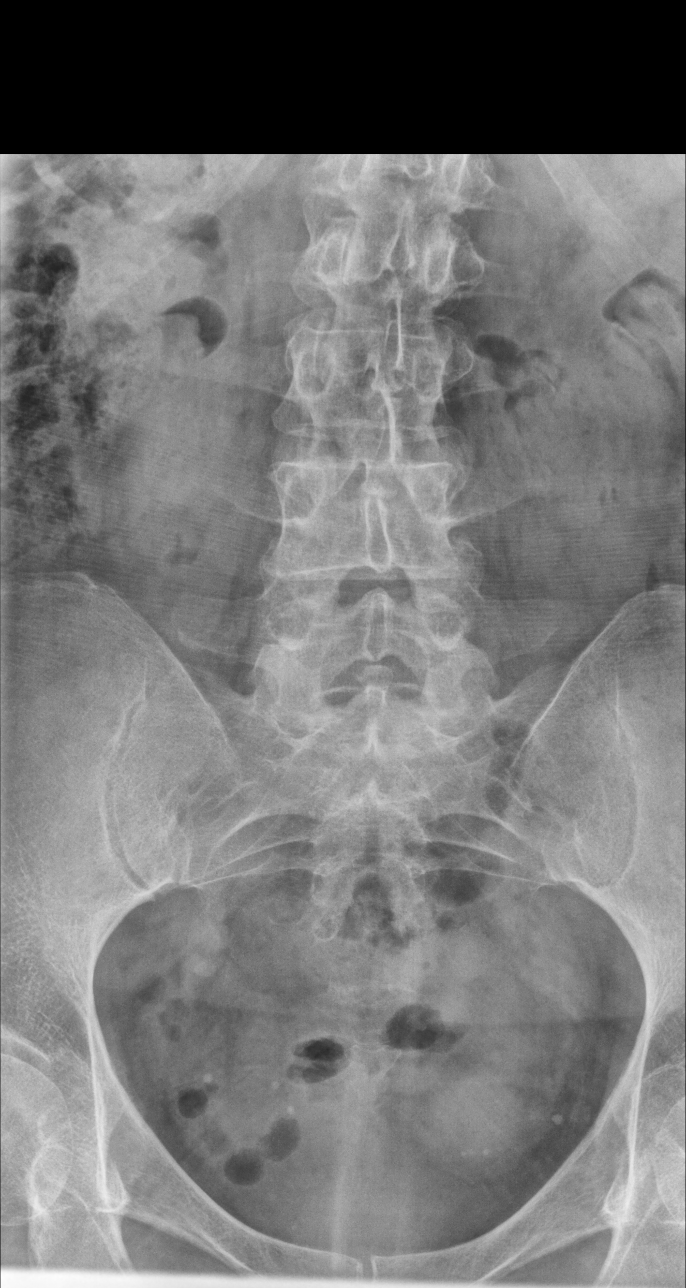

[lumbar spine lmo (1 of 2)]
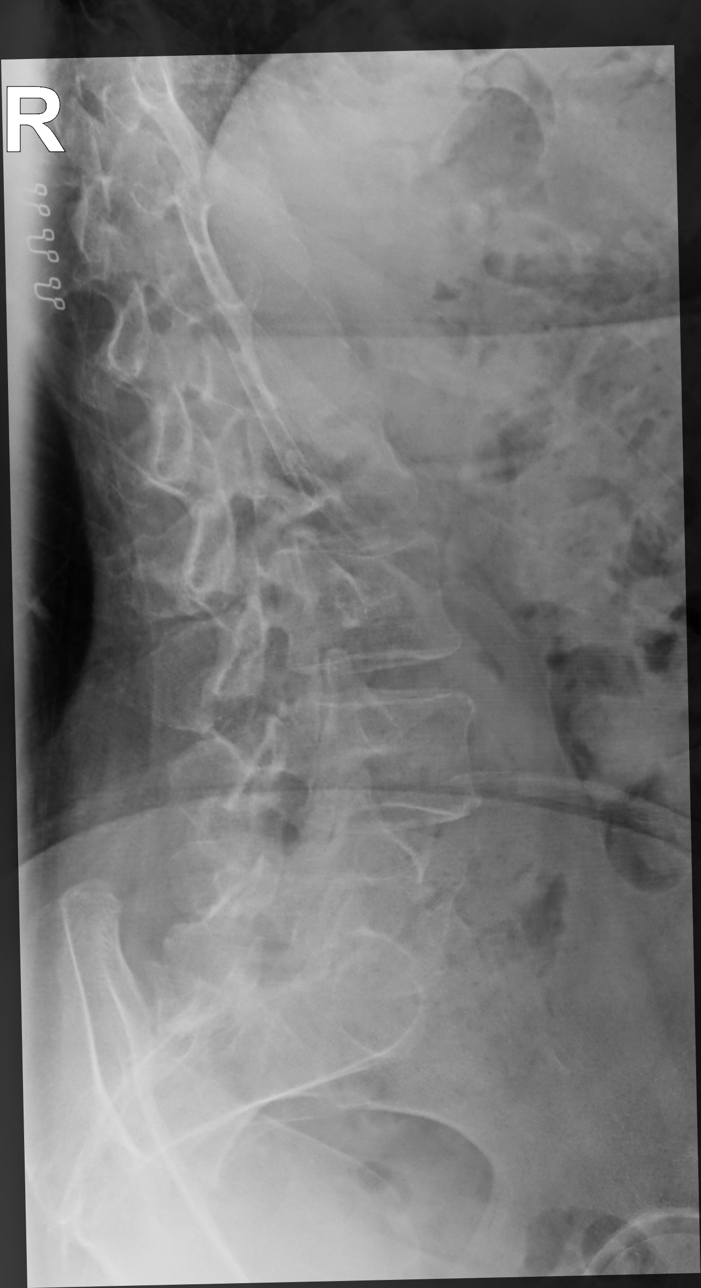

[lumbar spine lmo (2 of 2)]
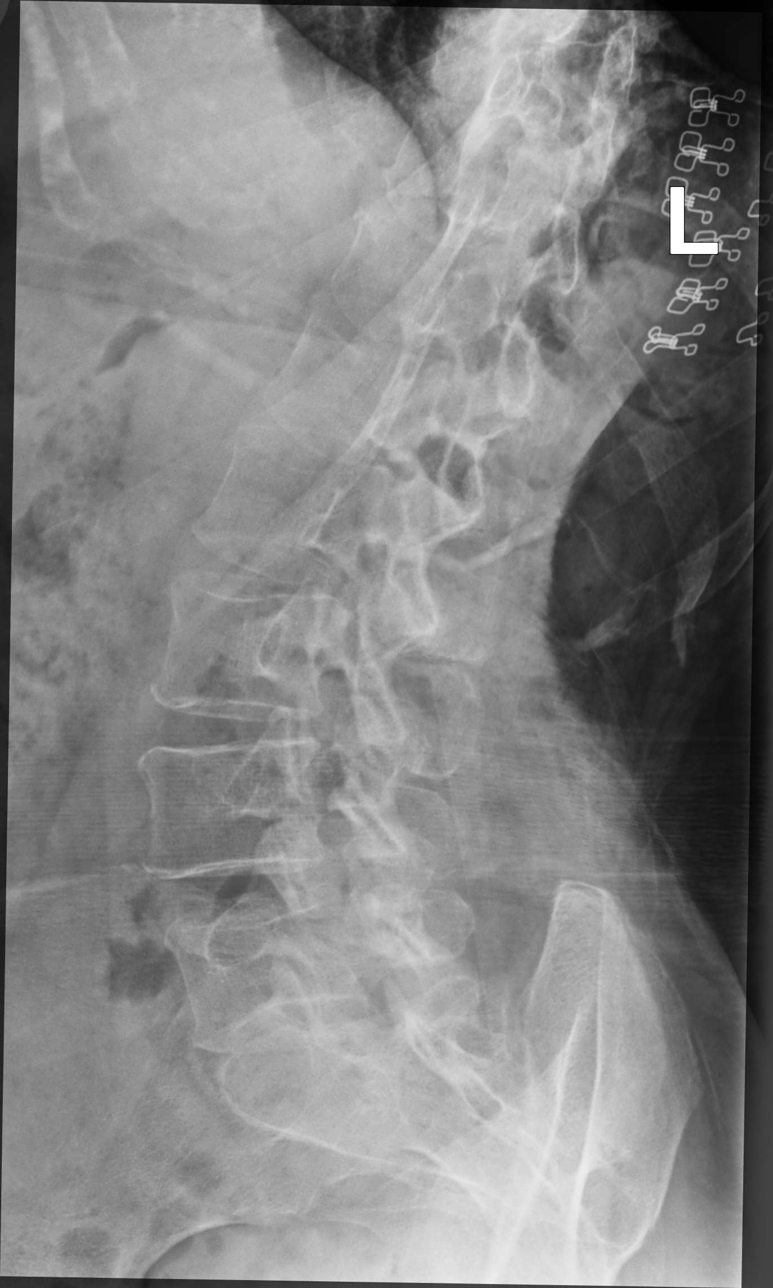

[lumbar spine lat (1 of 2)]
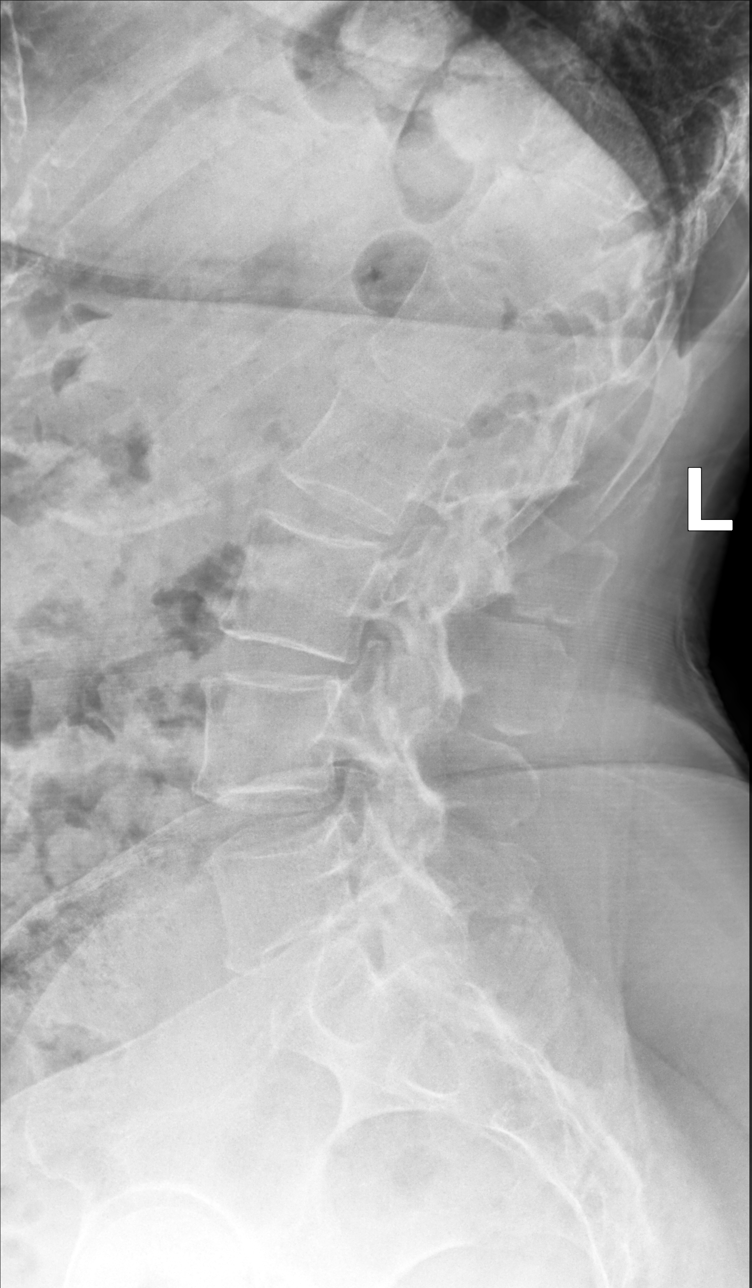

[lumbar spine lat (2 of 2)]
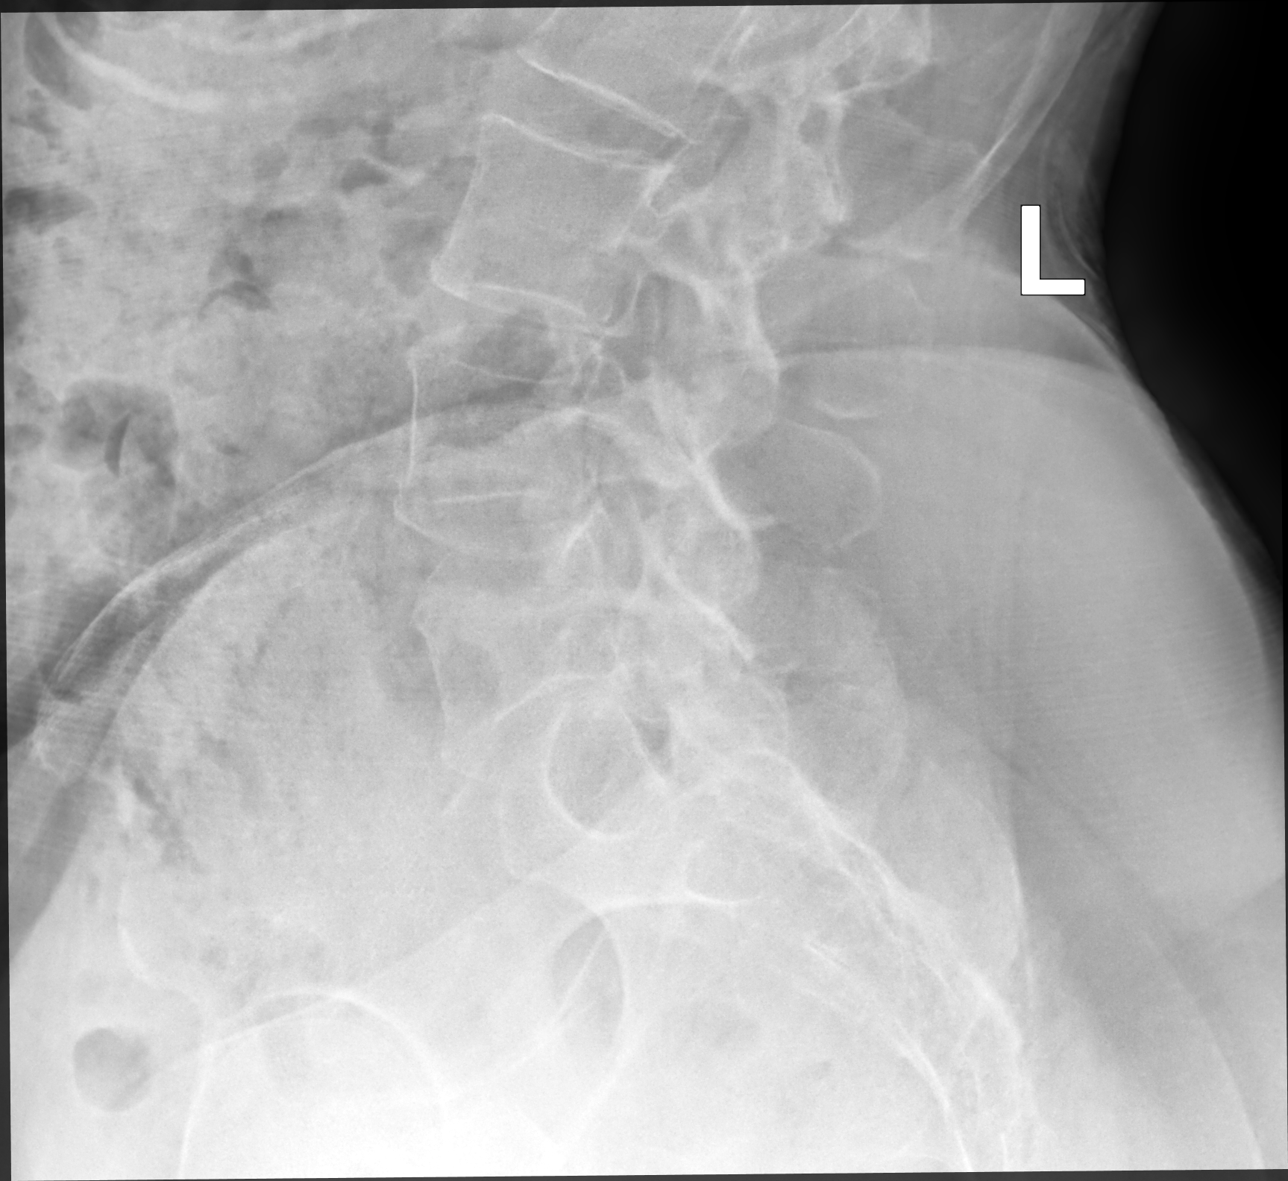

[5 of 5 positions shown; findings below may reference images not displayed]

FINDINGS: Five lumbar type vertebra. There is no acute fracture or subluxation
of the lumbar spine. Mild lower lumbar facet arthropathy. The soft
tissues are unremarkable.
IMPRESSION: No acute/traumatic lumbar spine pathology.

## 2023-09-15 ENCOUNTER — Ambulatory Visit (INDEPENDENT_AMBULATORY_CARE_PROVIDER_SITE_OTHER): Payer: Medicare Other | Admitting: Nurse Practitioner

## 2023-09-15 VITALS — BP 142/84 | HR 66 | Temp 97.8°F | Ht 67.0 in | Wt 157.0 lb

## 2023-09-15 DIAGNOSIS — I10 Essential (primary) hypertension: Secondary | ICD-10-CM | POA: Diagnosis not present

## 2023-09-15 NOTE — Patient Instructions (Signed)
 Get new BP machine Start amlodipine 5mg  in PM Maintain hydrochlorothiazide dose in AM. Maintain low salt diet

## 2023-09-15 NOTE — Progress Notes (Signed)
                Established Patient Visit  Patient: Audrey Wells   DOB: 07-15-1955   68 y.o. Female  MRN: [id number] Visit Date: 09/15/2023  Subjective:    Chief Complaint  Patient presents with   Follow-up    4 week f/u HTN  Refill requested on hydrochlorothiazide and Meloxicam    HPI Essential hypertension Did not start amlodipine as prescribed due to confusion with lisinopril. Did not get new BP machine. BP Readings from Last 3 Encounters:  09/15/23 (!) 142/84  07/22/23 (!) 160/80  03/23/23 127/70    Advised to start amlodipine as prescribed Can not take lisinopril due to previous adverse reaction-cough. Maintain hydrochlorothiazide dose F/up in 2months  Reviewed medical, surgical, and social history today  Medications: Outpatient Medications Prior to Visit  Medication Sig   amLODipine (NORVASC) 5 MG tablet Take 1 tablet (5 mg total) by mouth every evening.   hydrochlorothiazide (HYDRODIURIL) 25 MG tablet TAKE 1 TABLET(25 MG) BY MOUTH DAILY   meloxicam (MOBIC) 7.5 MG tablet TAKE 1 TABLET(7.5 MG) BY MOUTH DAILY with food.   Multiple Vitamins-Minerals (CENTRUM SILVER 50+WOMEN PO) Take by mouth.   potassium chloride SA (KLOR-CON M) 20 MEQ tablet Take 20 mEq by mouth daily.   pravastatin (PRAVACHOL) 40 MG tablet Take 1 tablet (40 mg total) by mouth daily.   No facility-administered medications prior to visit.   Reviewed past medical and social history.   ROS per HPI above      Objective:  BP (!) 142/84 (BP Location: Left Arm, Patient Position: Sitting, Cuff Size: Normal)   Pulse 66   Temp 97.8 F (36.6 C) (Temporal)   Ht 5\' 7"  (1.702 m)   Wt 157 lb (71.2 kg)   SpO2 98%   BMI 24.59 kg/m      Physical Exam Vitals and nursing note reviewed.  Cardiovascular:     Rate and Rhythm: Normal rate and regular rhythm.     Pulses: Normal pulses.     Heart sounds: Normal heart sounds.  Pulmonary:     Effort: Pulmonary effort is normal.     Breath sounds: Normal  breath sounds.  Musculoskeletal:     Right lower leg: No edema.     Left lower leg: No edema.  Neurological:     Mental Status: She is alert and oriented to person, place, and time.     No results found for any visits on 09/15/23.    Assessment & Plan:    Problem List Items Addressed This Visit     Essential hypertension - Primary   Did not start amlodipine as prescribed due to confusion with lisinopril. Did not get new BP machine. BP Readings from Last 3 Encounters:  09/15/23 (!) 142/84  07/22/23 (!) 160/80  03/23/23 127/70    Advised to start amlodipine as prescribed Can not take lisinopril due to previous adverse reaction-cough. Maintain hydrochlorothiazide dose F/up in 2months      Return in about 2 months (around 11/15/2023) for HTN.     Alysia Penna, NP

## 2023-09-15 NOTE — Assessment & Plan Note (Signed)
 Did not start amlodipine as prescribed due to confusion with lisinopril. Did not get new BP machine. BP Readings from Last 3 Encounters:  09/15/23 (!) 142/84  07/22/23 (!) 160/80  03/23/23 127/70    Advised to start amlodipine as prescribed Can not take lisinopril due to previous adverse reaction-cough. Maintain hydrochlorothiazide dose F/up in 2months

## 2023-10-21 ENCOUNTER — Other Ambulatory Visit: Payer: Self-pay | Admitting: Nurse Practitioner

## 2023-10-21 DIAGNOSIS — M545 Low back pain, unspecified: Secondary | ICD-10-CM

## 2023-10-21 DIAGNOSIS — E7800 Pure hypercholesterolemia, unspecified: Secondary | ICD-10-CM

## 2023-10-21 DIAGNOSIS — Z1231 Encounter for screening mammogram for malignant neoplasm of breast: Secondary | ICD-10-CM | POA: Diagnosis not present

## 2023-10-21 DIAGNOSIS — M5030 Other cervical disc degeneration, unspecified cervical region: Secondary | ICD-10-CM

## 2023-10-21 NOTE — Telephone Encounter (Signed)
 Medication: Pravastatin  (Pravachol ) 40 mg  Directions: Take 1 tablet by mouth daily.  Last given: 11/03/22 Number refills: 3 Last o/v: 09/15/23 Follow up: 2 months-11/20/23 (scheduled)  Labs: 07/22/23   Medication: Meloxicam  (Mobic ) 7.5 mg  Directions: Take 1 tablet by mouth every day with food  Last given: 07/22/23 Number refills: 1 Last o/v: 09/15/23 Follow up: 2 months-11/20/23 (Scheduled) Labs:

## 2023-10-26 ENCOUNTER — Other Ambulatory Visit: Payer: Self-pay | Admitting: Nurse Practitioner

## 2023-10-26 DIAGNOSIS — I10 Essential (primary) hypertension: Secondary | ICD-10-CM

## 2023-11-20 ENCOUNTER — Ambulatory Visit: Admitting: Nurse Practitioner

## 2023-11-20 ENCOUNTER — Encounter: Payer: Self-pay | Admitting: Nurse Practitioner

## 2023-11-20 ENCOUNTER — Ambulatory Visit: Payer: Self-pay | Admitting: Nurse Practitioner

## 2023-11-20 VITALS — BP 122/80 | HR 69 | Temp 96.5°F | Ht 66.0 in | Wt 156.2 lb

## 2023-11-20 DIAGNOSIS — I10 Essential (primary) hypertension: Secondary | ICD-10-CM | POA: Diagnosis not present

## 2023-11-20 DIAGNOSIS — E876 Hypokalemia: Secondary | ICD-10-CM

## 2023-11-20 LAB — BASIC METABOLIC PANEL WITH GFR
BUN: 15 mg/dL (ref 6–23)
CO2: 29 meq/L (ref 19–32)
Calcium: 9.2 mg/dL (ref 8.4–10.5)
Chloride: 101 meq/L (ref 96–112)
Creatinine, Ser: 0.78 mg/dL (ref 0.40–1.20)
GFR: 77.68 mL/min (ref >60.00)
Glucose, Bld: 115 mg/dL — ABNORMAL HIGH (ref 70–99)
Potassium: 3.3 meq/L — ABNORMAL LOW (ref 3.5–5.1)
Sodium: 139 meq/L (ref 135–145)

## 2023-11-20 MED ORDER — HYDROCHLOROTHIAZIDE 25 MG PO TABS: 25.0000 mg | ORAL_TABLET | Freq: Every day | ORAL | 1 refills | Status: DC

## 2023-11-20 MED ORDER — POTASSIUM CHLORIDE CRYS ER 20 MEQ PO TBCR: 20.0000 meq | EXTENDED_RELEASE_TABLET | Freq: Every day | ORAL | 1 refills | Status: DC

## 2023-11-20 MED ORDER — AMLODIPINE BESYLATE 5 MG PO TABS
5.0000 mg | ORAL_TABLET | Freq: Every evening | ORAL | 3 refills | Status: AC
Start: 2023-11-20 — End: ?

## 2023-11-20 NOTE — Patient Instructions (Addendum)
 Go to lab Maintain Heart healthy diet and daily exercise. Maintain current medications. Schedule appointment for mammogram

## 2023-11-20 NOTE — Progress Notes (Signed)
                Established Patient Visit  Patient: Audrey Wells   DOB: 09-08-1955   68 y.o. Female  MRN: [id number] Visit Date: 11/20/2023  Subjective:     Chief Complaint  Patient presents with   Follow-up    HTN no concerns    HPI Essential hypertension Home BP reading: 120/70, 109/68, 103/64, 116/66 Denies any adverse effects with amlodipine  and hydrochlorothiazide  BP Readings from Last 3 Encounters:  11/20/23 122/80  09/15/23 (!) 142/84  07/22/23 (!) 160/80    Repeat bmp Maintain current med doses F/up in 6months  Reviewed medical, surgical, and social history today  Medications: Outpatient Medications Prior to Visit  Medication Sig   Cholecalciferol (VITAMIN D3) 250 MCG (10000 UT) capsule Take 10,000 Units by mouth daily.   meloxicam  (MOBIC ) 7.5 MG tablet TAKE 1 TABLET BY MOUTH EVERY DAY WITH FOOD   Multiple Vitamins-Minerals (CENTRUM SILVER 50+WOMEN PO) Take by mouth.   potassium chloride  SA (KLOR-CON  M) 20 MEQ tablet Take 20 mEq by mouth daily.   pravastatin  (PRAVACHOL ) 40 MG tablet TAKE 1 TABLET(40 MG) BY MOUTH DAILY   [DISCONTINUED] amLODipine  (NORVASC ) 5 MG tablet Take 1 tablet (5 mg total) by mouth every evening.   [DISCONTINUED] hydrochlorothiazide  (HYDRODIURIL ) 25 MG tablet TAKE 1 TABLET(25 MG) BY MOUTH DAILY   No facility-administered medications prior to visit.   Reviewed past medical and social history.   ROS per HPI above      Objective:  BP 122/80 (BP Location: Left Arm, Patient Position: Sitting, Cuff Size: Small)   Pulse 69   Temp (!) 96.5 F (35.8 C) (Temporal)   Ht 5\' 6"  (1.676 m)   Wt 156 lb 3.2 oz (70.9 kg)   SpO2 98%   BMI 25.21 kg/m      Physical Exam Vitals reviewed.  Cardiovascular:     Rate and Rhythm: Normal rate and regular rhythm.     Pulses: Normal pulses.     Heart sounds: Normal heart sounds.  Pulmonary:     Effort: Pulmonary effort is normal.     Breath sounds: Normal breath sounds.  Musculoskeletal:      Right lower leg: No edema.     Left lower leg: No edema.  Neurological:     Mental Status: She is alert and oriented to person, place, and time.     No results found for any visits on 11/20/23.    Assessment & Plan:    Problem List Items Addressed This Visit     Essential hypertension - Primary   Home BP reading: 120/70, 109/68, 103/64, 116/66 Denies any adverse effects with amlodipine  and hydrochlorothiazide  BP Readings from Last 3 Encounters:  11/20/23 122/80  09/15/23 (!) 142/84  07/22/23 (!) 160/80    Repeat bmp Maintain current med doses F/up in 6months      Relevant Medications   amLODipine  (NORVASC ) 5 MG tablet   hydrochlorothiazide  (HYDRODIURIL ) 25 MG tablet   Other Relevant Orders   Basic metabolic panel with GFR   Return in about 6 months (around 05/22/2024) for HTN, hyperlipidemia (fasting).     Kathrene Parents, NP

## 2023-11-20 NOTE — Assessment & Plan Note (Signed)
 Home BP reading: 120/70, 109/68, 103/64, 116/66 Denies any adverse effects with amlodipine  and hydrochlorothiazide  BP Readings from Last 3 Encounters:  11/20/23 122/80  09/15/23 (!) 142/84  07/22/23 (!) 160/80    Repeat bmp Maintain current med doses F/up in 6months

## 2023-11-24 ENCOUNTER — Encounter: Payer: Self-pay | Admitting: Nurse Practitioner

## 2024-01-25 ENCOUNTER — Other Ambulatory Visit: Payer: Self-pay | Admitting: Nurse Practitioner

## 2024-01-25 DIAGNOSIS — G8929 Other chronic pain: Secondary | ICD-10-CM

## 2024-01-25 DIAGNOSIS — I10 Essential (primary) hypertension: Secondary | ICD-10-CM

## 2024-01-25 DIAGNOSIS — M5030 Other cervical disc degeneration, unspecified cervical region: Secondary | ICD-10-CM

## 2024-02-01 ENCOUNTER — Encounter: Payer: Self-pay | Admitting: Nurse Practitioner

## 2024-04-11 ENCOUNTER — Other Ambulatory Visit: Payer: Self-pay | Admitting: Nurse Practitioner

## 2024-04-11 DIAGNOSIS — I10 Essential (primary) hypertension: Secondary | ICD-10-CM

## 2024-04-21 ENCOUNTER — Other Ambulatory Visit: Payer: Self-pay | Admitting: Nurse Practitioner

## 2024-04-21 DIAGNOSIS — E876 Hypokalemia: Secondary | ICD-10-CM

## 2024-04-21 DIAGNOSIS — M5030 Other cervical disc degeneration, unspecified cervical region: Secondary | ICD-10-CM

## 2024-04-21 DIAGNOSIS — M545 Low back pain, unspecified: Secondary | ICD-10-CM

## 2024-04-23 ENCOUNTER — Other Ambulatory Visit: Payer: Self-pay | Admitting: Nurse Practitioner

## 2024-04-23 DIAGNOSIS — I10 Essential (primary) hypertension: Secondary | ICD-10-CM

## 2024-04-25 NOTE — Telephone Encounter (Signed)
 Contacted pharmacy to verify if the patient is out of refills. Spoke with PhT and she confirmed patient doesn't need any refills. She is good until February. Nothing further needed.

## 2024-05-23 ENCOUNTER — Ambulatory Visit: Admitting: Nurse Practitioner

## 2024-07-06 ENCOUNTER — Encounter: Payer: Self-pay | Admitting: Nurse Practitioner

## 2024-07-21 ENCOUNTER — Other Ambulatory Visit: Payer: Self-pay | Admitting: Nurse Practitioner

## 2024-07-21 DIAGNOSIS — G8929 Other chronic pain: Secondary | ICD-10-CM

## 2024-07-21 DIAGNOSIS — M5030 Other cervical disc degeneration, unspecified cervical region: Secondary | ICD-10-CM

## 2024-07-22 ENCOUNTER — Encounter: Payer: Self-pay | Admitting: Nurse Practitioner

## 2024-07-24 ENCOUNTER — Other Ambulatory Visit: Payer: Self-pay | Admitting: Nurse Practitioner

## 2024-07-24 DIAGNOSIS — E7800 Pure hypercholesterolemia, unspecified: Secondary | ICD-10-CM

## 2024-07-26 ENCOUNTER — Other Ambulatory Visit: Payer: Self-pay | Admitting: Nurse Practitioner

## 2024-07-26 DIAGNOSIS — E7800 Pure hypercholesterolemia, unspecified: Secondary | ICD-10-CM

## 2024-08-01 ENCOUNTER — Ambulatory Visit: Payer: Medicare Other

## 2024-08-02 ENCOUNTER — Ambulatory Visit: Admitting: Nurse Practitioner
